# Patient Record
Sex: Female | Born: 1945
Health system: Southern US, Community
[De-identification: ages and names within clinical notes are randomized; demographics above are authoritative.]

## PROBLEM LIST (undated history)

## (undated) DIAGNOSIS — I10 Essential (primary) hypertension: Secondary | ICD-10-CM

## (undated) HISTORY — DX: Essential (primary) hypertension: I10

## (undated) HISTORY — PX: MASTECTOMY: SHX3

---

## 2015-04-06 ENCOUNTER — Other Ambulatory Visit: Payer: Self-pay | Admitting: Vascular Surgery

## 2015-04-06 DIAGNOSIS — R590 Localized enlarged lymph nodes: Secondary | ICD-10-CM

## 2015-04-14 ENCOUNTER — Ambulatory Visit
Admission: RE | Admit: 2015-04-14 | Discharge: 2015-04-14 | Disposition: A | Discharge status: Home / Self Care | Payer: Medicare HMO | Source: Ambulatory Visit | Attending: Vascular Surgery | Admitting: Vascular Surgery

## 2015-04-14 DIAGNOSIS — R590 Localized enlarged lymph nodes: Secondary | ICD-10-CM

## 2015-04-14 MED ORDER — GADOBENATE DIMEGLUMINE 529 MG/ML IV SOLN
19.0000 mL | Freq: Once | INTRAVENOUS | Status: AC | PRN
  Administered 2015-04-14: 19 mL via INTRAVENOUS

## 2015-04-18 ENCOUNTER — Other Ambulatory Visit: Payer: Self-pay

## 2015-09-14 DIAGNOSIS — Z23 Encounter for immunization: Secondary | ICD-10-CM | POA: Diagnosis not present

## 2015-09-19 DIAGNOSIS — C50911 Malignant neoplasm of unspecified site of right female breast: Secondary | ICD-10-CM | POA: Diagnosis not present

## 2015-09-19 DIAGNOSIS — C50919 Malignant neoplasm of unspecified site of unspecified female breast: Secondary | ICD-10-CM | POA: Diagnosis not present

## 2015-09-19 DIAGNOSIS — R590 Localized enlarged lymph nodes: Secondary | ICD-10-CM | POA: Diagnosis not present

## 2015-09-21 DIAGNOSIS — C773 Secondary and unspecified malignant neoplasm of axilla and upper limb lymph nodes: Secondary | ICD-10-CM | POA: Diagnosis not present

## 2015-09-21 DIAGNOSIS — C50911 Malignant neoplasm of unspecified site of right female breast: Secondary | ICD-10-CM | POA: Diagnosis not present

## 2015-09-26 DIAGNOSIS — Z6831 Body mass index (BMI) 31.0-31.9, adult: Secondary | ICD-10-CM | POA: Diagnosis not present

## 2015-09-26 DIAGNOSIS — C773 Secondary and unspecified malignant neoplasm of axilla and upper limb lymph nodes: Secondary | ICD-10-CM | POA: Diagnosis not present

## 2015-09-26 DIAGNOSIS — R590 Localized enlarged lymph nodes: Secondary | ICD-10-CM | POA: Diagnosis not present

## 2015-09-26 DIAGNOSIS — E669 Obesity, unspecified: Secondary | ICD-10-CM | POA: Diagnosis not present

## 2015-10-03 DIAGNOSIS — Z01818 Encounter for other preprocedural examination: Secondary | ICD-10-CM | POA: Diagnosis not present

## 2015-10-05 DIAGNOSIS — E7800 Pure hypercholesterolemia, unspecified: Secondary | ICD-10-CM | POA: Diagnosis not present

## 2015-10-05 DIAGNOSIS — C773 Secondary and unspecified malignant neoplasm of axilla and upper limb lymph nodes: Secondary | ICD-10-CM | POA: Diagnosis not present

## 2015-10-05 DIAGNOSIS — Z7982 Long term (current) use of aspirin: Secondary | ICD-10-CM | POA: Diagnosis not present

## 2015-10-05 DIAGNOSIS — C50911 Malignant neoplasm of unspecified site of right female breast: Secondary | ICD-10-CM | POA: Diagnosis not present

## 2015-10-05 DIAGNOSIS — C801 Malignant (primary) neoplasm, unspecified: Secondary | ICD-10-CM | POA: Diagnosis not present

## 2015-10-05 DIAGNOSIS — E119 Type 2 diabetes mellitus without complications: Secondary | ICD-10-CM | POA: Diagnosis not present

## 2015-10-05 DIAGNOSIS — Z79899 Other long term (current) drug therapy: Secondary | ICD-10-CM | POA: Diagnosis not present

## 2015-10-05 DIAGNOSIS — I10 Essential (primary) hypertension: Secondary | ICD-10-CM | POA: Diagnosis not present

## 2015-10-05 DIAGNOSIS — E669 Obesity, unspecified: Secondary | ICD-10-CM | POA: Diagnosis not present

## 2015-10-05 DIAGNOSIS — Z6831 Body mass index (BMI) 31.0-31.9, adult: Secondary | ICD-10-CM | POA: Diagnosis not present

## 2015-11-02 DIAGNOSIS — C50911 Malignant neoplasm of unspecified site of right female breast: Secondary | ICD-10-CM | POA: Diagnosis not present

## 2015-11-04 DIAGNOSIS — C50911 Malignant neoplasm of unspecified site of right female breast: Secondary | ICD-10-CM | POA: Diagnosis not present

## 2015-11-04 DIAGNOSIS — Z510 Encounter for antineoplastic radiation therapy: Secondary | ICD-10-CM | POA: Diagnosis not present

## 2015-11-09 DIAGNOSIS — Z510 Encounter for antineoplastic radiation therapy: Secondary | ICD-10-CM | POA: Diagnosis not present

## 2015-11-09 DIAGNOSIS — S52572A Other intraarticular fracture of lower end of left radius, initial encounter for closed fracture: Secondary | ICD-10-CM | POA: Diagnosis not present

## 2015-11-09 DIAGNOSIS — Z853 Personal history of malignant neoplasm of breast: Secondary | ICD-10-CM | POA: Diagnosis not present

## 2015-11-09 DIAGNOSIS — W109XXA Fall (on) (from) unspecified stairs and steps, initial encounter: Secondary | ICD-10-CM | POA: Diagnosis not present

## 2015-11-09 DIAGNOSIS — S59202A Unspecified physeal fracture of lower end of radius, left arm, initial encounter for closed fracture: Secondary | ICD-10-CM | POA: Diagnosis not present

## 2015-11-09 DIAGNOSIS — S0003XA Contusion of scalp, initial encounter: Secondary | ICD-10-CM | POA: Diagnosis not present

## 2015-11-09 DIAGNOSIS — R51 Headache: Secondary | ICD-10-CM | POA: Diagnosis not present

## 2015-11-09 DIAGNOSIS — C50911 Malignant neoplasm of unspecified site of right female breast: Secondary | ICD-10-CM | POA: Diagnosis not present

## 2015-11-09 DIAGNOSIS — S52502A Unspecified fracture of the lower end of left radius, initial encounter for closed fracture: Secondary | ICD-10-CM | POA: Diagnosis not present

## 2015-11-09 DIAGNOSIS — S0083XA Contusion of other part of head, initial encounter: Secondary | ICD-10-CM | POA: Diagnosis not present

## 2015-11-10 DIAGNOSIS — C50911 Malignant neoplasm of unspecified site of right female breast: Secondary | ICD-10-CM | POA: Diagnosis not present

## 2015-11-14 DIAGNOSIS — S52502A Unspecified fracture of the lower end of left radius, initial encounter for closed fracture: Secondary | ICD-10-CM | POA: Diagnosis not present

## 2015-11-16 DIAGNOSIS — Z510 Encounter for antineoplastic radiation therapy: Secondary | ICD-10-CM | POA: Diagnosis not present

## 2015-11-16 DIAGNOSIS — C50911 Malignant neoplasm of unspecified site of right female breast: Secondary | ICD-10-CM | POA: Diagnosis not present

## 2015-11-17 DIAGNOSIS — C50911 Malignant neoplasm of unspecified site of right female breast: Secondary | ICD-10-CM | POA: Diagnosis not present

## 2015-11-17 DIAGNOSIS — Z510 Encounter for antineoplastic radiation therapy: Secondary | ICD-10-CM | POA: Diagnosis not present

## 2015-11-18 DIAGNOSIS — Z510 Encounter for antineoplastic radiation therapy: Secondary | ICD-10-CM | POA: Diagnosis not present

## 2015-11-18 DIAGNOSIS — C50911 Malignant neoplasm of unspecified site of right female breast: Secondary | ICD-10-CM | POA: Diagnosis not present

## 2015-11-22 DIAGNOSIS — Z510 Encounter for antineoplastic radiation therapy: Secondary | ICD-10-CM | POA: Diagnosis not present

## 2015-11-22 DIAGNOSIS — C50911 Malignant neoplasm of unspecified site of right female breast: Secondary | ICD-10-CM | POA: Diagnosis not present

## 2015-11-23 DIAGNOSIS — Z510 Encounter for antineoplastic radiation therapy: Secondary | ICD-10-CM | POA: Diagnosis not present

## 2015-11-23 DIAGNOSIS — C50911 Malignant neoplasm of unspecified site of right female breast: Secondary | ICD-10-CM | POA: Diagnosis not present

## 2015-11-24 DIAGNOSIS — C50911 Malignant neoplasm of unspecified site of right female breast: Secondary | ICD-10-CM | POA: Diagnosis not present

## 2015-11-24 DIAGNOSIS — Z510 Encounter for antineoplastic radiation therapy: Secondary | ICD-10-CM | POA: Diagnosis not present

## 2015-11-25 DIAGNOSIS — C50911 Malignant neoplasm of unspecified site of right female breast: Secondary | ICD-10-CM | POA: Diagnosis not present

## 2015-11-25 DIAGNOSIS — Z510 Encounter for antineoplastic radiation therapy: Secondary | ICD-10-CM | POA: Diagnosis not present

## 2015-11-29 DIAGNOSIS — C50911 Malignant neoplasm of unspecified site of right female breast: Secondary | ICD-10-CM | POA: Diagnosis not present

## 2015-11-29 DIAGNOSIS — Z510 Encounter for antineoplastic radiation therapy: Secondary | ICD-10-CM | POA: Diagnosis not present

## 2015-11-30 DIAGNOSIS — C50911 Malignant neoplasm of unspecified site of right female breast: Secondary | ICD-10-CM | POA: Diagnosis not present

## 2015-11-30 DIAGNOSIS — Z510 Encounter for antineoplastic radiation therapy: Secondary | ICD-10-CM | POA: Diagnosis not present

## 2015-12-01 DIAGNOSIS — C50911 Malignant neoplasm of unspecified site of right female breast: Secondary | ICD-10-CM | POA: Diagnosis not present

## 2015-12-01 DIAGNOSIS — Z510 Encounter for antineoplastic radiation therapy: Secondary | ICD-10-CM | POA: Diagnosis not present

## 2015-12-02 DIAGNOSIS — Z510 Encounter for antineoplastic radiation therapy: Secondary | ICD-10-CM | POA: Diagnosis not present

## 2015-12-02 DIAGNOSIS — C50911 Malignant neoplasm of unspecified site of right female breast: Secondary | ICD-10-CM | POA: Diagnosis not present

## 2015-12-05 DIAGNOSIS — Z510 Encounter for antineoplastic radiation therapy: Secondary | ICD-10-CM | POA: Diagnosis not present

## 2015-12-05 DIAGNOSIS — C50911 Malignant neoplasm of unspecified site of right female breast: Secondary | ICD-10-CM | POA: Diagnosis not present

## 2015-12-06 DIAGNOSIS — C50911 Malignant neoplasm of unspecified site of right female breast: Secondary | ICD-10-CM | POA: Diagnosis not present

## 2015-12-06 DIAGNOSIS — Z510 Encounter for antineoplastic radiation therapy: Secondary | ICD-10-CM | POA: Diagnosis not present

## 2015-12-07 DIAGNOSIS — C50819 Malignant neoplasm of overlapping sites of unspecified female breast: Secondary | ICD-10-CM | POA: Diagnosis not present

## 2015-12-07 DIAGNOSIS — Z79899 Other long term (current) drug therapy: Secondary | ICD-10-CM | POA: Diagnosis not present

## 2015-12-07 DIAGNOSIS — C50911 Malignant neoplasm of unspecified site of right female breast: Secondary | ICD-10-CM | POA: Diagnosis not present

## 2015-12-07 DIAGNOSIS — E7800 Pure hypercholesterolemia, unspecified: Secondary | ICD-10-CM | POA: Diagnosis not present

## 2015-12-07 DIAGNOSIS — Z510 Encounter for antineoplastic radiation therapy: Secondary | ICD-10-CM | POA: Diagnosis not present

## 2015-12-07 DIAGNOSIS — S52532D Colles' fracture of left radius, subsequent encounter for closed fracture with routine healing: Secondary | ICD-10-CM | POA: Diagnosis not present

## 2015-12-07 DIAGNOSIS — I10 Essential (primary) hypertension: Secondary | ICD-10-CM | POA: Diagnosis not present

## 2015-12-07 DIAGNOSIS — E119 Type 2 diabetes mellitus without complications: Secondary | ICD-10-CM | POA: Diagnosis not present

## 2015-12-08 DIAGNOSIS — C50911 Malignant neoplasm of unspecified site of right female breast: Secondary | ICD-10-CM | POA: Diagnosis not present

## 2015-12-08 DIAGNOSIS — E119 Type 2 diabetes mellitus without complications: Secondary | ICD-10-CM | POA: Diagnosis not present

## 2015-12-08 DIAGNOSIS — Z510 Encounter for antineoplastic radiation therapy: Secondary | ICD-10-CM | POA: Diagnosis not present

## 2015-12-09 DIAGNOSIS — C50911 Malignant neoplasm of unspecified site of right female breast: Secondary | ICD-10-CM | POA: Diagnosis not present

## 2015-12-09 DIAGNOSIS — Z510 Encounter for antineoplastic radiation therapy: Secondary | ICD-10-CM | POA: Diagnosis not present

## 2015-12-10 DIAGNOSIS — H524 Presbyopia: Secondary | ICD-10-CM | POA: Diagnosis not present

## 2015-12-10 DIAGNOSIS — E119 Type 2 diabetes mellitus without complications: Secondary | ICD-10-CM | POA: Diagnosis not present

## 2015-12-10 DIAGNOSIS — H5213 Myopia, bilateral: Secondary | ICD-10-CM | POA: Diagnosis not present

## 2015-12-12 DIAGNOSIS — C50911 Malignant neoplasm of unspecified site of right female breast: Secondary | ICD-10-CM | POA: Diagnosis not present

## 2015-12-12 DIAGNOSIS — Z510 Encounter for antineoplastic radiation therapy: Secondary | ICD-10-CM | POA: Diagnosis not present

## 2015-12-13 DIAGNOSIS — Z510 Encounter for antineoplastic radiation therapy: Secondary | ICD-10-CM | POA: Diagnosis not present

## 2015-12-13 DIAGNOSIS — C50911 Malignant neoplasm of unspecified site of right female breast: Secondary | ICD-10-CM | POA: Diagnosis not present

## 2015-12-14 DIAGNOSIS — Z510 Encounter for antineoplastic radiation therapy: Secondary | ICD-10-CM | POA: Diagnosis not present

## 2015-12-14 DIAGNOSIS — C50911 Malignant neoplasm of unspecified site of right female breast: Secondary | ICD-10-CM | POA: Diagnosis not present

## 2015-12-15 DIAGNOSIS — Z510 Encounter for antineoplastic radiation therapy: Secondary | ICD-10-CM | POA: Diagnosis not present

## 2015-12-15 DIAGNOSIS — C50911 Malignant neoplasm of unspecified site of right female breast: Secondary | ICD-10-CM | POA: Diagnosis not present

## 2015-12-16 DIAGNOSIS — Z510 Encounter for antineoplastic radiation therapy: Secondary | ICD-10-CM | POA: Diagnosis not present

## 2015-12-16 DIAGNOSIS — C50911 Malignant neoplasm of unspecified site of right female breast: Secondary | ICD-10-CM | POA: Diagnosis not present

## 2015-12-19 DIAGNOSIS — S52532D Colles' fracture of left radius, subsequent encounter for closed fracture with routine healing: Secondary | ICD-10-CM | POA: Diagnosis not present

## 2015-12-19 DIAGNOSIS — Z510 Encounter for antineoplastic radiation therapy: Secondary | ICD-10-CM | POA: Diagnosis not present

## 2015-12-19 DIAGNOSIS — C50911 Malignant neoplasm of unspecified site of right female breast: Secondary | ICD-10-CM | POA: Diagnosis not present

## 2015-12-20 DIAGNOSIS — C50911 Malignant neoplasm of unspecified site of right female breast: Secondary | ICD-10-CM | POA: Diagnosis not present

## 2015-12-20 DIAGNOSIS — Z510 Encounter for antineoplastic radiation therapy: Secondary | ICD-10-CM | POA: Diagnosis not present

## 2015-12-21 DIAGNOSIS — Z510 Encounter for antineoplastic radiation therapy: Secondary | ICD-10-CM | POA: Diagnosis not present

## 2015-12-21 DIAGNOSIS — C50911 Malignant neoplasm of unspecified site of right female breast: Secondary | ICD-10-CM | POA: Diagnosis not present

## 2015-12-22 DIAGNOSIS — Z510 Encounter for antineoplastic radiation therapy: Secondary | ICD-10-CM | POA: Diagnosis not present

## 2015-12-22 DIAGNOSIS — C50911 Malignant neoplasm of unspecified site of right female breast: Secondary | ICD-10-CM | POA: Diagnosis not present

## 2015-12-23 DIAGNOSIS — Z510 Encounter for antineoplastic radiation therapy: Secondary | ICD-10-CM | POA: Diagnosis not present

## 2015-12-23 DIAGNOSIS — C50911 Malignant neoplasm of unspecified site of right female breast: Secondary | ICD-10-CM | POA: Diagnosis not present

## 2015-12-26 DIAGNOSIS — Z510 Encounter for antineoplastic radiation therapy: Secondary | ICD-10-CM | POA: Diagnosis not present

## 2015-12-26 DIAGNOSIS — C50911 Malignant neoplasm of unspecified site of right female breast: Secondary | ICD-10-CM | POA: Diagnosis not present

## 2015-12-27 DIAGNOSIS — Z510 Encounter for antineoplastic radiation therapy: Secondary | ICD-10-CM | POA: Diagnosis not present

## 2015-12-27 DIAGNOSIS — C50911 Malignant neoplasm of unspecified site of right female breast: Secondary | ICD-10-CM | POA: Diagnosis not present

## 2015-12-28 DIAGNOSIS — Z510 Encounter for antineoplastic radiation therapy: Secondary | ICD-10-CM | POA: Diagnosis not present

## 2015-12-28 DIAGNOSIS — C50911 Malignant neoplasm of unspecified site of right female breast: Secondary | ICD-10-CM | POA: Diagnosis not present

## 2015-12-29 DIAGNOSIS — Z510 Encounter for antineoplastic radiation therapy: Secondary | ICD-10-CM | POA: Diagnosis not present

## 2015-12-29 DIAGNOSIS — C50911 Malignant neoplasm of unspecified site of right female breast: Secondary | ICD-10-CM | POA: Diagnosis not present

## 2015-12-30 DIAGNOSIS — Z510 Encounter for antineoplastic radiation therapy: Secondary | ICD-10-CM | POA: Diagnosis not present

## 2015-12-30 DIAGNOSIS — C50911 Malignant neoplasm of unspecified site of right female breast: Secondary | ICD-10-CM | POA: Diagnosis not present

## 2016-01-02 DIAGNOSIS — C50911 Malignant neoplasm of unspecified site of right female breast: Secondary | ICD-10-CM | POA: Diagnosis not present

## 2016-01-03 DIAGNOSIS — Z510 Encounter for antineoplastic radiation therapy: Secondary | ICD-10-CM | POA: Diagnosis not present

## 2016-01-03 DIAGNOSIS — C50911 Malignant neoplasm of unspecified site of right female breast: Secondary | ICD-10-CM | POA: Diagnosis not present

## 2016-01-04 DIAGNOSIS — C50911 Malignant neoplasm of unspecified site of right female breast: Secondary | ICD-10-CM | POA: Diagnosis not present

## 2016-01-04 DIAGNOSIS — Z510 Encounter for antineoplastic radiation therapy: Secondary | ICD-10-CM | POA: Diagnosis not present

## 2016-01-09 DIAGNOSIS — S52532D Colles' fracture of left radius, subsequent encounter for closed fracture with routine healing: Secondary | ICD-10-CM | POA: Diagnosis not present

## 2016-01-19 DIAGNOSIS — H18411 Arcus senilis, right eye: Secondary | ICD-10-CM | POA: Diagnosis not present

## 2016-01-19 DIAGNOSIS — H18412 Arcus senilis, left eye: Secondary | ICD-10-CM | POA: Diagnosis not present

## 2016-01-19 DIAGNOSIS — H2512 Age-related nuclear cataract, left eye: Secondary | ICD-10-CM | POA: Diagnosis not present

## 2016-01-19 DIAGNOSIS — H2511 Age-related nuclear cataract, right eye: Secondary | ICD-10-CM | POA: Diagnosis not present

## 2016-02-01 DIAGNOSIS — C50911 Malignant neoplasm of unspecified site of right female breast: Secondary | ICD-10-CM | POA: Diagnosis not present

## 2016-02-01 DIAGNOSIS — C779 Secondary and unspecified malignant neoplasm of lymph node, unspecified: Secondary | ICD-10-CM | POA: Diagnosis not present

## 2016-02-01 DIAGNOSIS — C773 Secondary and unspecified malignant neoplasm of axilla and upper limb lymph nodes: Secondary | ICD-10-CM | POA: Diagnosis not present

## 2016-02-01 DIAGNOSIS — C50919 Malignant neoplasm of unspecified site of unspecified female breast: Secondary | ICD-10-CM | POA: Diagnosis not present

## 2016-02-09 DIAGNOSIS — E785 Hyperlipidemia, unspecified: Secondary | ICD-10-CM

## 2016-02-09 DIAGNOSIS — E088 Diabetes mellitus due to underlying condition with unspecified complications: Secondary | ICD-10-CM

## 2016-02-09 DIAGNOSIS — I10 Essential (primary) hypertension: Secondary | ICD-10-CM

## 2016-02-09 HISTORY — DX: Essential (primary) hypertension: I10

## 2016-02-09 HISTORY — DX: Diabetes mellitus due to underlying condition with unspecified complications: E08.8

## 2016-02-09 HISTORY — DX: Hyperlipidemia, unspecified: E78.5

## 2016-02-24 DIAGNOSIS — H2512 Age-related nuclear cataract, left eye: Secondary | ICD-10-CM | POA: Diagnosis not present

## 2016-02-24 DIAGNOSIS — H25812 Combined forms of age-related cataract, left eye: Secondary | ICD-10-CM | POA: Diagnosis not present

## 2016-02-24 DIAGNOSIS — H2511 Age-related nuclear cataract, right eye: Secondary | ICD-10-CM | POA: Diagnosis not present

## 2016-03-02 DIAGNOSIS — H2513 Age-related nuclear cataract, bilateral: Secondary | ICD-10-CM | POA: Diagnosis not present

## 2016-03-05 DIAGNOSIS — H25811 Combined forms of age-related cataract, right eye: Secondary | ICD-10-CM | POA: Diagnosis not present

## 2016-03-05 DIAGNOSIS — H2511 Age-related nuclear cataract, right eye: Secondary | ICD-10-CM | POA: Diagnosis not present

## 2016-03-12 DIAGNOSIS — H2513 Age-related nuclear cataract, bilateral: Secondary | ICD-10-CM | POA: Diagnosis not present

## 2016-03-18 IMAGING — MR MR BREAST BILAT WO/W CM
6 of 13 series · 23 of 48 positions shown · IV contrast (19cc multihance)
Comparison: Previous exam(s).

CLINICAL DATA: History of biopsy-proven metastatic right axillary
lymph node 03/14/2015 suggesting ductal origin. Patient's mammogram
03/01/2015 is negative, other than the abnormal right axillary
adenopathy. MRI performed to evaluate for occult breast primary
malignancy.

LABS:  Labs at [HOSPITAL], BUN/creatinine =20/1.0 with
GFR 59.
EXAM:
BILATERAL BREAST MRI WITH AND WITHOUT CONTRAST
TECHNIQUE: Multiplanar, multisequence MR images of both breasts were obtained
prior to and following the intravenous administration of 19 ml of
MultiHance.

[Series 2: T2 · axial · 3.0mm · 0.48mm/px · z∈[-105,+63]mm · 3 of 57 slices shown]
[im 1/57]
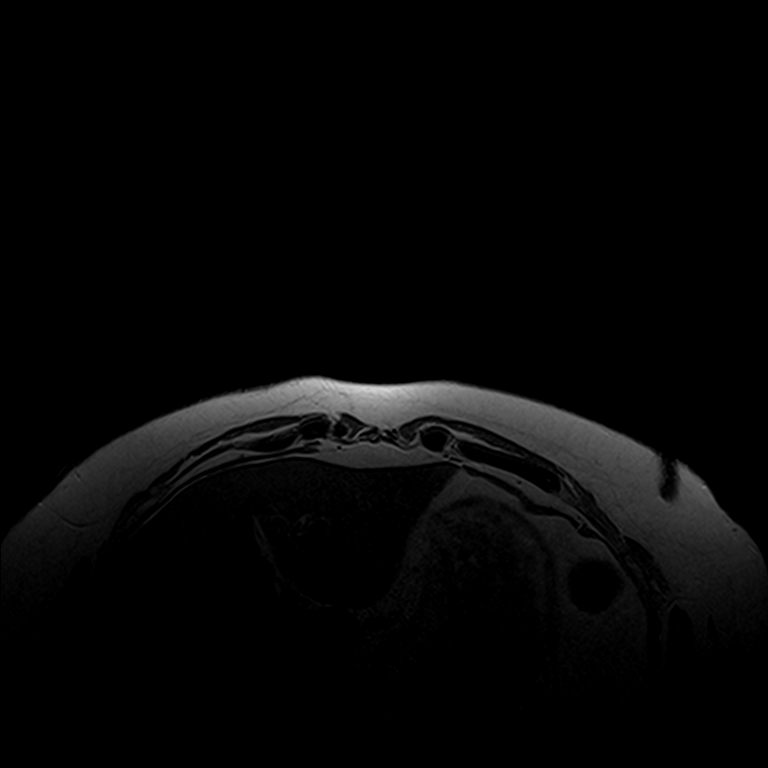
[im 29/57]
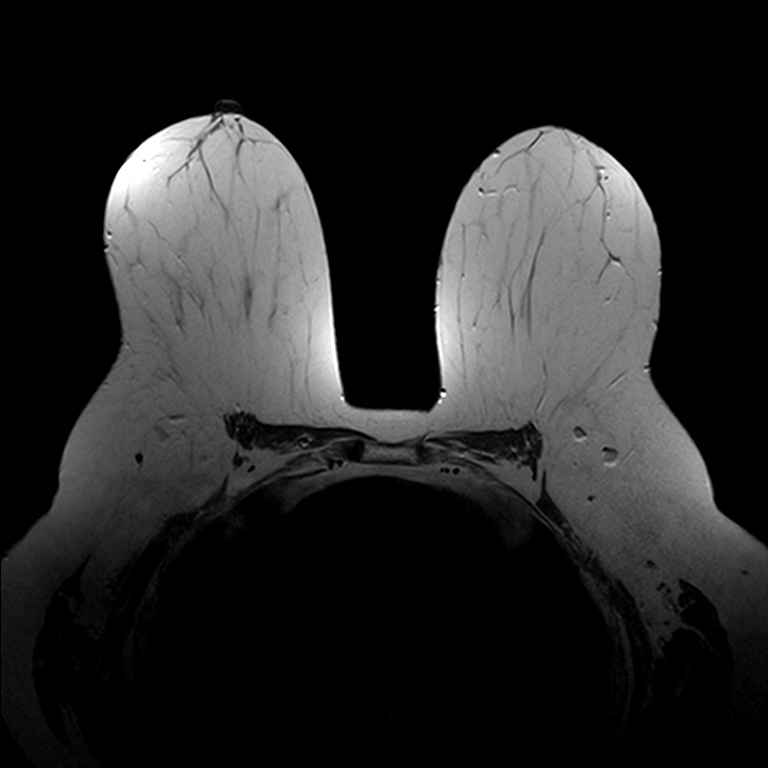
[im 57/57]
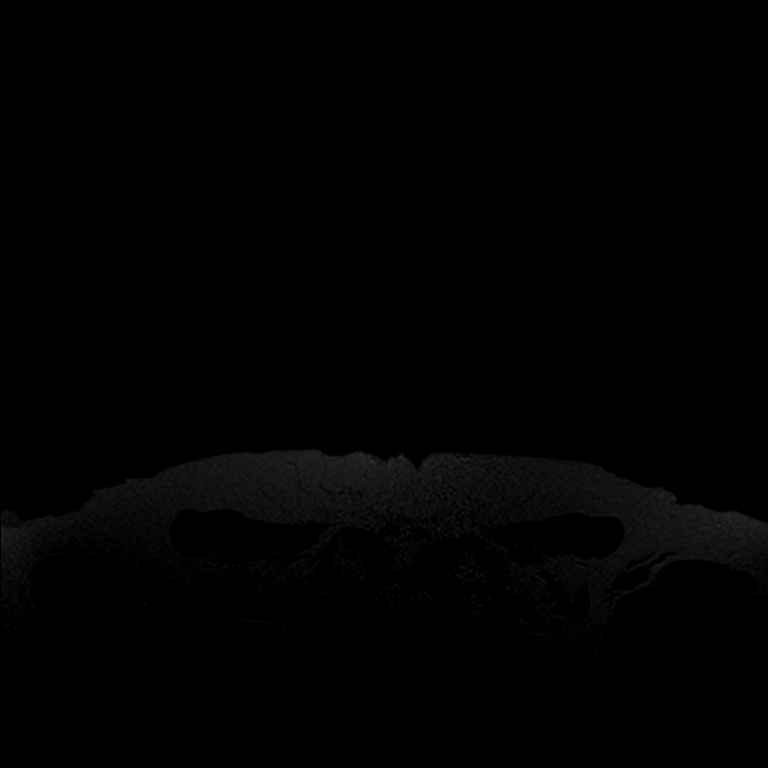

[Series 3: t2_tirm_tra ipat (a-p) · axial · 3.0mm · 0.72mm/px · z∈[-105,+63]mm · 2 of 57 slices shown]
[im 1/57]
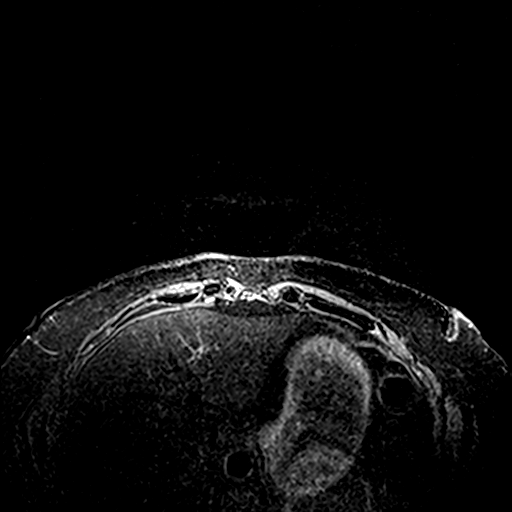
[im 57/57]
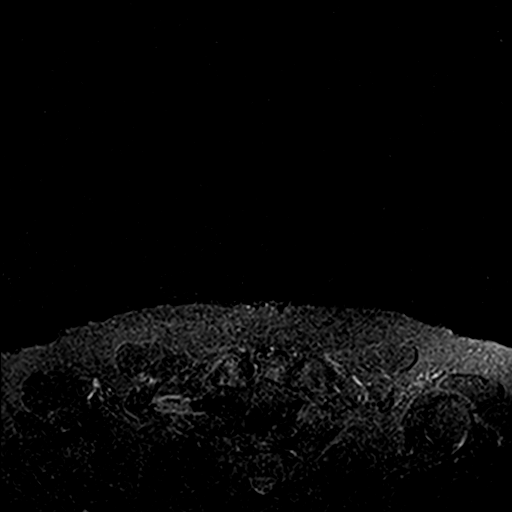

[Series 4: fl3d pre-cm no · axial · non-contrast · 1.2mm · 0.96mm/px · z∈[-116,+74]mm · 5 of 160 slices shown]
[im 1/160]
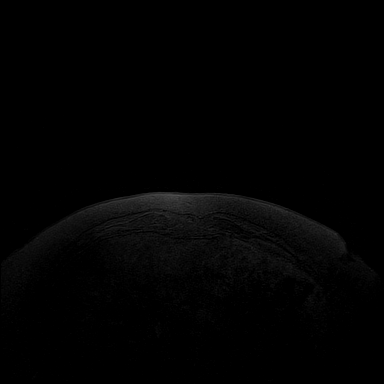
[im 40/160]
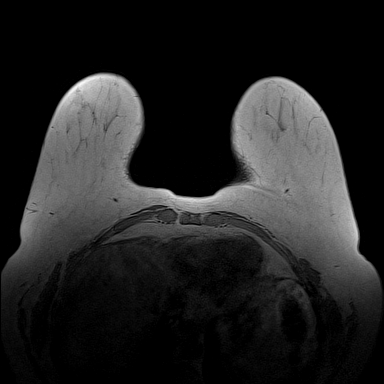
[im 80/160]
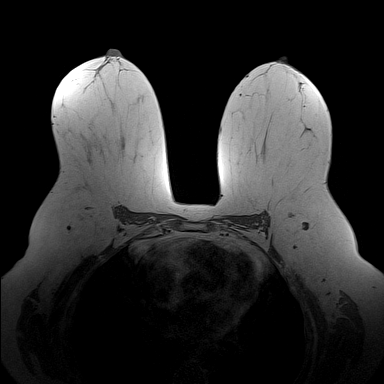
[im 120/160]
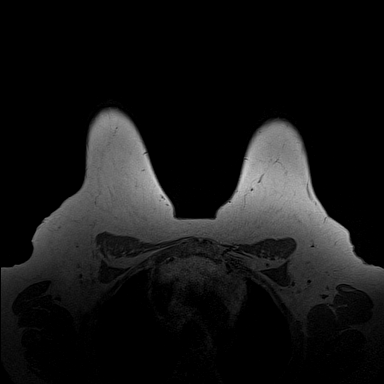
[im 160/160]
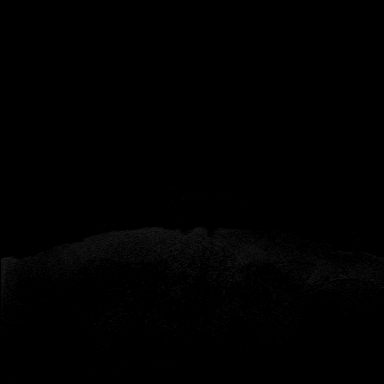

[Series 5: fl3d pre-cm · axial · non-contrast · 1.2mm · 0.96mm/px · z∈[-116,+74]mm · 5 of 160 slices shown]
[im 1/160]
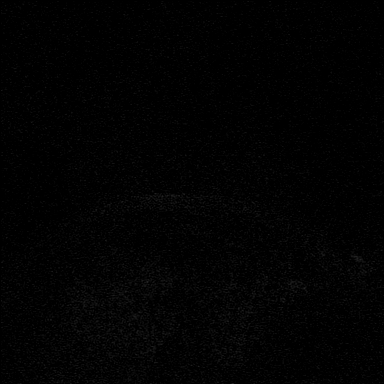
[im 40/160]
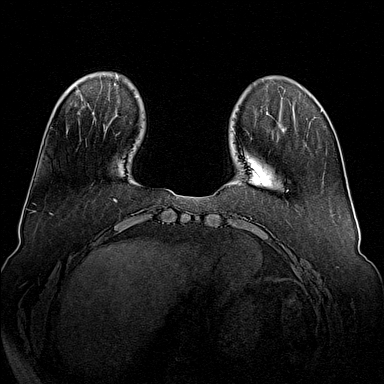
[im 80/160]
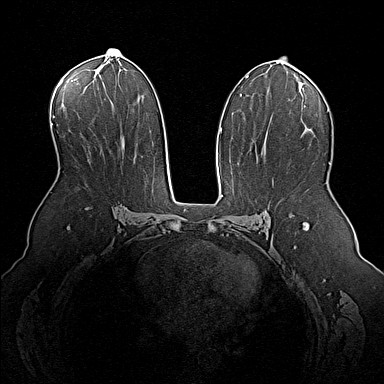
[im 120/160]
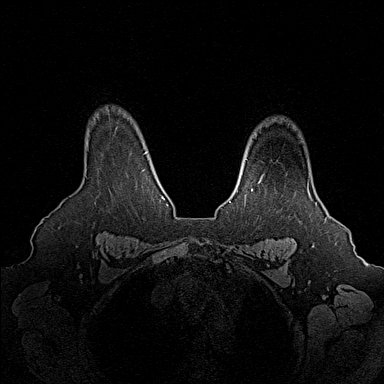
[im 160/160]
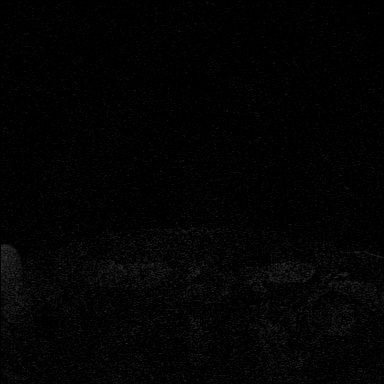

[Series 6: fl3d post immediate · axial · 1.2mm · 0.96mm/px · z∈[-116,+74]mm · 5 of 160 slices shown (1 of 2)]
[im 1/160]
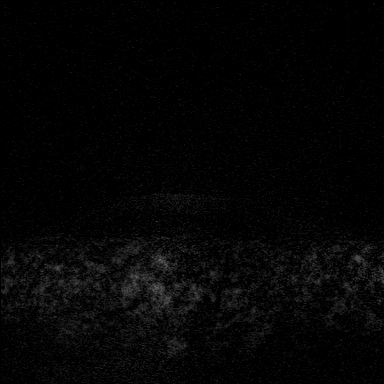
[im 40/160]
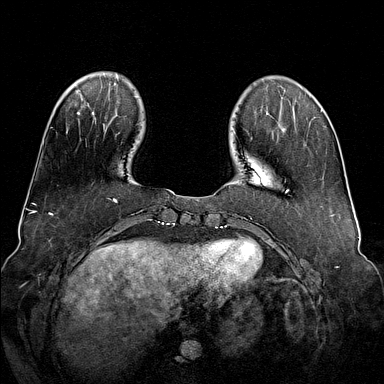
[im 80/160]
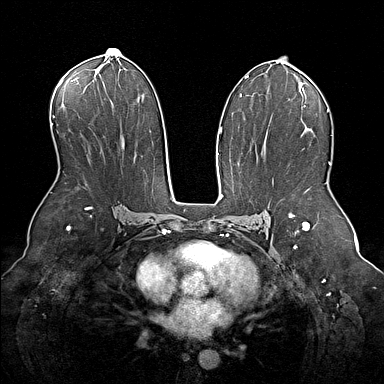
[im 120/160]
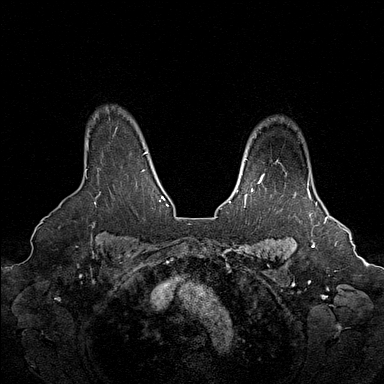
[im 160/160]
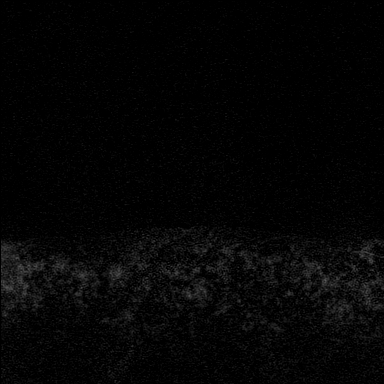

[Series 7: fl3d post immediate · axial · 1.2mm · 0.96mm/px · z∈[-116,-22]mm · 3 of 160 slices shown (2 of 2)]
[im 1/160]
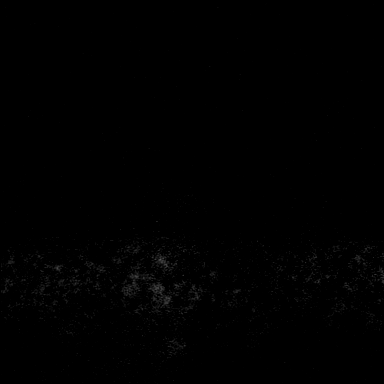
[im 40/160]
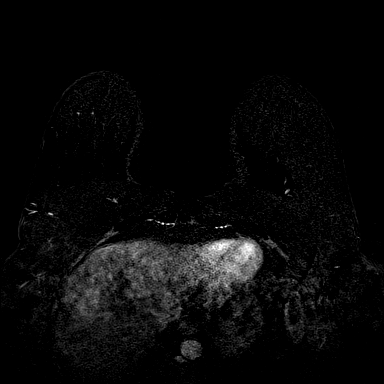
[im 80/160]
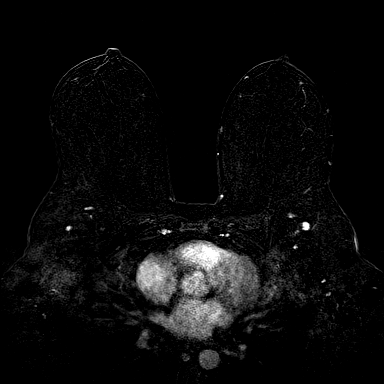

[23 of 48 positions shown; findings below may reference images not displayed]

THREE-DIMENSIONAL MR IMAGE RENDERING ON INDEPENDENT WORKSTATION:

Three-dimensional MR images were rendered by post-processing of the
original MR data on an independent workstation. The
three-dimensional MR images were interpreted, and findings are
reported in the following complete MRI report for this study. Three
dimensional images were evaluated at the independent [employer]
workstation
FINDINGS: Breast composition:  Almost entirely fat.

Background parenchymal enhancement: Minimal.

Right breast: No suspicious mass or abnormal enhancement. 6 mm oval
circumscribed enhancing mass with central fatty hilum over the inner
upper quadrant compatible with an intramammary lymph node
correlating with patient's mammogram.

Left breast: No mass or abnormal enhancement.

Lymph nodes: Biopsy-proven metastatic right axillary lymph node
measuring 0.9 x 1.9 x 0.8 cm. A few other smaller adjacent
suspicious right axillary lymph nodes are present. No significant
left axillary adenopathy no internal mammary adenopathy.

Ancillary findings: 2 small T1/T2 low signal foci with enhancement
over the lower body of the sternum as cannot exclude metastatic
disease.
IMPRESSION: Evidence of patient's biopsy-proven metastatic right axillary
adenopathy. No evidence of suspicious mass or enhancement within the
right breast.

Two small enhancing foci over the inferior body of the sternum as
cannot exclude metastatic disease.

RECOMMENDATION:
Recommend followup as per clinical treatment plan. Consider PET-CT
to search for primary malignancy. Also consider bone scan to exclude
osseous metastatic disease.

BI-RADS CATEGORY  6: Known biopsy-proven malignancy.

## 2016-03-19 DIAGNOSIS — E119 Type 2 diabetes mellitus without complications: Secondary | ICD-10-CM | POA: Diagnosis not present

## 2016-03-19 DIAGNOSIS — I10 Essential (primary) hypertension: Secondary | ICD-10-CM | POA: Diagnosis not present

## 2016-03-19 DIAGNOSIS — E559 Vitamin D deficiency, unspecified: Secondary | ICD-10-CM | POA: Diagnosis not present

## 2016-03-19 DIAGNOSIS — E7800 Pure hypercholesterolemia, unspecified: Secondary | ICD-10-CM | POA: Diagnosis not present

## 2016-03-23 DIAGNOSIS — E559 Vitamin D deficiency, unspecified: Secondary | ICD-10-CM | POA: Diagnosis not present

## 2016-03-23 DIAGNOSIS — E119 Type 2 diabetes mellitus without complications: Secondary | ICD-10-CM | POA: Diagnosis not present

## 2016-03-23 DIAGNOSIS — E7800 Pure hypercholesterolemia, unspecified: Secondary | ICD-10-CM | POA: Diagnosis not present

## 2016-03-23 DIAGNOSIS — I10 Essential (primary) hypertension: Secondary | ICD-10-CM | POA: Diagnosis not present

## 2016-03-30 DIAGNOSIS — H2513 Age-related nuclear cataract, bilateral: Secondary | ICD-10-CM | POA: Diagnosis not present

## 2016-03-30 DIAGNOSIS — H5319 Other subjective visual disturbances: Secondary | ICD-10-CM | POA: Diagnosis not present

## 2016-04-16 DIAGNOSIS — Z6832 Body mass index (BMI) 32.0-32.9, adult: Secondary | ICD-10-CM

## 2016-04-16 DIAGNOSIS — C50911 Malignant neoplasm of unspecified site of right female breast: Secondary | ICD-10-CM | POA: Insufficient documentation

## 2016-04-16 DIAGNOSIS — C773 Secondary and unspecified malignant neoplasm of axilla and upper limb lymph nodes: Secondary | ICD-10-CM

## 2016-04-16 DIAGNOSIS — E669 Obesity, unspecified: Secondary | ICD-10-CM | POA: Diagnosis not present

## 2016-04-16 HISTORY — DX: Body mass index (BMI) 32.0-32.9, adult: Z68.32

## 2016-04-16 HISTORY — DX: Malignant neoplasm of unspecified site of right female breast: C50.911

## 2016-04-20 DIAGNOSIS — Z1231 Encounter for screening mammogram for malignant neoplasm of breast: Secondary | ICD-10-CM | POA: Diagnosis not present

## 2016-06-01 DIAGNOSIS — Z853 Personal history of malignant neoplasm of breast: Secondary | ICD-10-CM | POA: Diagnosis not present

## 2016-06-20 DIAGNOSIS — Z6834 Body mass index (BMI) 34.0-34.9, adult: Secondary | ICD-10-CM | POA: Diagnosis not present

## 2016-06-20 DIAGNOSIS — Z79899 Other long term (current) drug therapy: Secondary | ICD-10-CM | POA: Diagnosis not present

## 2016-06-20 DIAGNOSIS — Z0000 Encounter for general adult medical examination without abnormal findings: Secondary | ICD-10-CM | POA: Diagnosis not present

## 2016-06-20 DIAGNOSIS — E119 Type 2 diabetes mellitus without complications: Secondary | ICD-10-CM | POA: Diagnosis not present

## 2016-06-20 DIAGNOSIS — I10 Essential (primary) hypertension: Secondary | ICD-10-CM | POA: Diagnosis not present

## 2016-06-20 DIAGNOSIS — E669 Obesity, unspecified: Secondary | ICD-10-CM | POA: Diagnosis not present

## 2016-06-20 DIAGNOSIS — M859 Disorder of bone density and structure, unspecified: Secondary | ICD-10-CM | POA: Diagnosis not present

## 2016-06-20 DIAGNOSIS — K219 Gastro-esophageal reflux disease without esophagitis: Secondary | ICD-10-CM | POA: Diagnosis not present

## 2016-06-20 DIAGNOSIS — C50819 Malignant neoplasm of overlapping sites of unspecified female breast: Secondary | ICD-10-CM | POA: Diagnosis not present

## 2016-06-20 DIAGNOSIS — E7800 Pure hypercholesterolemia, unspecified: Secondary | ICD-10-CM | POA: Diagnosis not present

## 2016-06-21 DIAGNOSIS — E119 Type 2 diabetes mellitus without complications: Secondary | ICD-10-CM | POA: Diagnosis not present

## 2016-06-21 DIAGNOSIS — E7800 Pure hypercholesterolemia, unspecified: Secondary | ICD-10-CM | POA: Diagnosis not present

## 2016-08-13 DIAGNOSIS — E088 Diabetes mellitus due to underlying condition with unspecified complications: Secondary | ICD-10-CM | POA: Diagnosis not present

## 2016-08-13 DIAGNOSIS — E785 Hyperlipidemia, unspecified: Secondary | ICD-10-CM | POA: Diagnosis not present

## 2016-08-13 DIAGNOSIS — I10 Essential (primary) hypertension: Secondary | ICD-10-CM | POA: Diagnosis not present

## 2016-09-11 DIAGNOSIS — E785 Hyperlipidemia, unspecified: Secondary | ICD-10-CM | POA: Diagnosis not present

## 2016-09-11 DIAGNOSIS — R9439 Abnormal result of other cardiovascular function study: Secondary | ICD-10-CM | POA: Diagnosis not present

## 2016-09-11 DIAGNOSIS — I10 Essential (primary) hypertension: Secondary | ICD-10-CM | POA: Diagnosis not present

## 2016-09-11 DIAGNOSIS — E088 Diabetes mellitus due to underlying condition with unspecified complications: Secondary | ICD-10-CM | POA: Diagnosis not present

## 2016-09-11 DIAGNOSIS — R079 Chest pain, unspecified: Secondary | ICD-10-CM | POA: Diagnosis not present

## 2016-09-14 DIAGNOSIS — E785 Hyperlipidemia, unspecified: Secondary | ICD-10-CM | POA: Diagnosis not present

## 2016-09-14 DIAGNOSIS — E119 Type 2 diabetes mellitus without complications: Secondary | ICD-10-CM | POA: Diagnosis not present

## 2016-09-14 DIAGNOSIS — I2510 Atherosclerotic heart disease of native coronary artery without angina pectoris: Secondary | ICD-10-CM | POA: Diagnosis not present

## 2016-09-14 DIAGNOSIS — Z7984 Long term (current) use of oral hypoglycemic drugs: Secondary | ICD-10-CM | POA: Diagnosis not present

## 2016-09-14 DIAGNOSIS — Z7982 Long term (current) use of aspirin: Secondary | ICD-10-CM | POA: Diagnosis not present

## 2016-09-14 DIAGNOSIS — R0602 Shortness of breath: Secondary | ICD-10-CM | POA: Diagnosis not present

## 2016-09-14 DIAGNOSIS — Z79899 Other long term (current) drug therapy: Secondary | ICD-10-CM | POA: Diagnosis not present

## 2016-09-14 DIAGNOSIS — I10 Essential (primary) hypertension: Secondary | ICD-10-CM | POA: Diagnosis not present

## 2016-09-14 DIAGNOSIS — R931 Abnormal findings on diagnostic imaging of heart and coronary circulation: Secondary | ICD-10-CM | POA: Diagnosis not present

## 2016-09-14 DIAGNOSIS — Z8249 Family history of ischemic heart disease and other diseases of the circulatory system: Secondary | ICD-10-CM | POA: Diagnosis not present

## 2016-09-20 DIAGNOSIS — E119 Type 2 diabetes mellitus without complications: Secondary | ICD-10-CM | POA: Diagnosis not present

## 2016-09-20 DIAGNOSIS — Z23 Encounter for immunization: Secondary | ICD-10-CM | POA: Diagnosis not present

## 2016-09-20 DIAGNOSIS — E7800 Pure hypercholesterolemia, unspecified: Secondary | ICD-10-CM | POA: Diagnosis not present

## 2016-09-20 DIAGNOSIS — I10 Essential (primary) hypertension: Secondary | ICD-10-CM | POA: Diagnosis not present

## 2016-10-02 DIAGNOSIS — Z853 Personal history of malignant neoplasm of breast: Secondary | ICD-10-CM | POA: Diagnosis not present

## 2016-10-04 DIAGNOSIS — E785 Hyperlipidemia, unspecified: Secondary | ICD-10-CM | POA: Diagnosis not present

## 2016-10-04 DIAGNOSIS — I2510 Atherosclerotic heart disease of native coronary artery without angina pectoris: Secondary | ICD-10-CM | POA: Insufficient documentation

## 2016-10-04 DIAGNOSIS — I10 Essential (primary) hypertension: Secondary | ICD-10-CM | POA: Diagnosis not present

## 2016-10-04 DIAGNOSIS — E088 Diabetes mellitus due to underlying condition with unspecified complications: Secondary | ICD-10-CM | POA: Diagnosis not present

## 2016-10-04 HISTORY — DX: Atherosclerotic heart disease of native coronary artery without angina pectoris: I25.10

## 2016-10-23 DIAGNOSIS — Z452 Encounter for adjustment and management of vascular access device: Secondary | ICD-10-CM | POA: Insufficient documentation

## 2016-10-23 DIAGNOSIS — Z6833 Body mass index (BMI) 33.0-33.9, adult: Secondary | ICD-10-CM | POA: Diagnosis not present

## 2016-10-23 DIAGNOSIS — E669 Obesity, unspecified: Secondary | ICD-10-CM | POA: Diagnosis not present

## 2016-10-26 DIAGNOSIS — Z7984 Long term (current) use of oral hypoglycemic drugs: Secondary | ICD-10-CM | POA: Diagnosis not present

## 2016-10-26 DIAGNOSIS — Z853 Personal history of malignant neoplasm of breast: Secondary | ICD-10-CM | POA: Diagnosis not present

## 2016-10-26 DIAGNOSIS — I10 Essential (primary) hypertension: Secondary | ICD-10-CM | POA: Diagnosis not present

## 2016-10-26 DIAGNOSIS — E785 Hyperlipidemia, unspecified: Secondary | ICD-10-CM | POA: Diagnosis not present

## 2016-10-26 DIAGNOSIS — Z79899 Other long term (current) drug therapy: Secondary | ICD-10-CM | POA: Diagnosis not present

## 2016-10-26 DIAGNOSIS — I2510 Atherosclerotic heart disease of native coronary artery without angina pectoris: Secondary | ICD-10-CM | POA: Diagnosis not present

## 2016-10-26 DIAGNOSIS — Z452 Encounter for adjustment and management of vascular access device: Secondary | ICD-10-CM | POA: Diagnosis not present

## 2016-10-26 DIAGNOSIS — K219 Gastro-esophageal reflux disease without esophagitis: Secondary | ICD-10-CM | POA: Diagnosis not present

## 2016-10-26 DIAGNOSIS — E119 Type 2 diabetes mellitus without complications: Secondary | ICD-10-CM | POA: Diagnosis not present

## 2016-10-26 DIAGNOSIS — G4733 Obstructive sleep apnea (adult) (pediatric): Secondary | ICD-10-CM | POA: Diagnosis not present

## 2016-11-05 DIAGNOSIS — Z09 Encounter for follow-up examination after completed treatment for conditions other than malignant neoplasm: Secondary | ICD-10-CM | POA: Insufficient documentation

## 2016-11-14 DIAGNOSIS — E119 Type 2 diabetes mellitus without complications: Secondary | ICD-10-CM | POA: Diagnosis not present

## 2016-11-14 DIAGNOSIS — Z853 Personal history of malignant neoplasm of breast: Secondary | ICD-10-CM | POA: Diagnosis not present

## 2016-11-14 DIAGNOSIS — Z9011 Acquired absence of right breast and nipple: Secondary | ICD-10-CM

## 2016-11-14 DIAGNOSIS — Z7984 Long term (current) use of oral hypoglycemic drugs: Secondary | ICD-10-CM | POA: Diagnosis not present

## 2016-11-14 DIAGNOSIS — I10 Essential (primary) hypertension: Secondary | ICD-10-CM | POA: Diagnosis not present

## 2016-11-14 HISTORY — DX: Acquired absence of right breast and nipple: Z90.11

## 2016-11-15 DIAGNOSIS — C50911 Malignant neoplasm of unspecified site of right female breast: Secondary | ICD-10-CM | POA: Diagnosis not present

## 2016-11-15 DIAGNOSIS — I2510 Atherosclerotic heart disease of native coronary artery without angina pectoris: Secondary | ICD-10-CM | POA: Diagnosis not present

## 2016-12-24 DIAGNOSIS — Z79899 Other long term (current) drug therapy: Secondary | ICD-10-CM | POA: Diagnosis not present

## 2016-12-24 DIAGNOSIS — E119 Type 2 diabetes mellitus without complications: Secondary | ICD-10-CM | POA: Diagnosis not present

## 2016-12-24 DIAGNOSIS — E7800 Pure hypercholesterolemia, unspecified: Secondary | ICD-10-CM | POA: Diagnosis not present

## 2016-12-24 DIAGNOSIS — I10 Essential (primary) hypertension: Secondary | ICD-10-CM | POA: Diagnosis not present

## 2017-01-14 DIAGNOSIS — I10 Essential (primary) hypertension: Secondary | ICD-10-CM | POA: Diagnosis not present

## 2017-01-14 DIAGNOSIS — I2510 Atherosclerotic heart disease of native coronary artery without angina pectoris: Secondary | ICD-10-CM | POA: Diagnosis not present

## 2017-01-14 DIAGNOSIS — E088 Diabetes mellitus due to underlying condition with unspecified complications: Secondary | ICD-10-CM | POA: Diagnosis not present

## 2017-01-14 DIAGNOSIS — E785 Hyperlipidemia, unspecified: Secondary | ICD-10-CM | POA: Diagnosis not present

## 2017-01-30 DIAGNOSIS — Z9221 Personal history of antineoplastic chemotherapy: Secondary | ICD-10-CM | POA: Diagnosis not present

## 2017-01-30 DIAGNOSIS — Z9011 Acquired absence of right breast and nipple: Secondary | ICD-10-CM | POA: Diagnosis not present

## 2017-01-30 DIAGNOSIS — Z171 Estrogen receptor negative status [ER-]: Secondary | ICD-10-CM | POA: Diagnosis not present

## 2017-01-30 DIAGNOSIS — Z923 Personal history of irradiation: Secondary | ICD-10-CM | POA: Diagnosis not present

## 2017-01-30 DIAGNOSIS — Z853 Personal history of malignant neoplasm of breast: Secondary | ICD-10-CM | POA: Diagnosis not present

## 2017-02-11 DIAGNOSIS — I10 Essential (primary) hypertension: Secondary | ICD-10-CM | POA: Diagnosis not present

## 2017-02-11 DIAGNOSIS — I2510 Atherosclerotic heart disease of native coronary artery without angina pectoris: Secondary | ICD-10-CM | POA: Diagnosis not present

## 2017-02-11 DIAGNOSIS — E785 Hyperlipidemia, unspecified: Secondary | ICD-10-CM | POA: Diagnosis not present

## 2017-02-11 DIAGNOSIS — E088 Diabetes mellitus due to underlying condition with unspecified complications: Secondary | ICD-10-CM | POA: Diagnosis not present

## 2017-03-01 DIAGNOSIS — E785 Hyperlipidemia, unspecified: Secondary | ICD-10-CM | POA: Diagnosis not present

## 2017-03-25 DIAGNOSIS — E119 Type 2 diabetes mellitus without complications: Secondary | ICD-10-CM | POA: Diagnosis not present

## 2017-03-25 DIAGNOSIS — Z79899 Other long term (current) drug therapy: Secondary | ICD-10-CM | POA: Diagnosis not present

## 2017-03-25 DIAGNOSIS — I10 Essential (primary) hypertension: Secondary | ICD-10-CM | POA: Diagnosis not present

## 2017-03-25 DIAGNOSIS — E7800 Pure hypercholesterolemia, unspecified: Secondary | ICD-10-CM | POA: Diagnosis not present

## 2017-03-25 DIAGNOSIS — M1712 Unilateral primary osteoarthritis, left knee: Secondary | ICD-10-CM | POA: Diagnosis not present

## 2017-04-01 DIAGNOSIS — M25562 Pain in left knee: Secondary | ICD-10-CM | POA: Diagnosis not present

## 2017-04-01 DIAGNOSIS — M1712 Unilateral primary osteoarthritis, left knee: Secondary | ICD-10-CM | POA: Diagnosis not present

## 2017-04-01 DIAGNOSIS — H524 Presbyopia: Secondary | ICD-10-CM | POA: Diagnosis not present

## 2017-04-02 DIAGNOSIS — M179 Osteoarthritis of knee, unspecified: Secondary | ICD-10-CM | POA: Diagnosis not present

## 2017-04-02 DIAGNOSIS — M25562 Pain in left knee: Secondary | ICD-10-CM | POA: Diagnosis not present

## 2017-04-03 DIAGNOSIS — R2689 Other abnormalities of gait and mobility: Secondary | ICD-10-CM | POA: Diagnosis not present

## 2017-04-03 DIAGNOSIS — M2560 Stiffness of unspecified joint, not elsewhere classified: Secondary | ICD-10-CM | POA: Diagnosis not present

## 2017-04-03 DIAGNOSIS — M25562 Pain in left knee: Secondary | ICD-10-CM | POA: Diagnosis not present

## 2017-04-05 DIAGNOSIS — M2560 Stiffness of unspecified joint, not elsewhere classified: Secondary | ICD-10-CM | POA: Diagnosis not present

## 2017-04-05 DIAGNOSIS — R2689 Other abnormalities of gait and mobility: Secondary | ICD-10-CM | POA: Diagnosis not present

## 2017-04-05 DIAGNOSIS — M25562 Pain in left knee: Secondary | ICD-10-CM | POA: Diagnosis not present

## 2017-04-10 DIAGNOSIS — M25562 Pain in left knee: Secondary | ICD-10-CM | POA: Diagnosis not present

## 2017-04-10 DIAGNOSIS — M2560 Stiffness of unspecified joint, not elsewhere classified: Secondary | ICD-10-CM | POA: Diagnosis not present

## 2017-04-10 DIAGNOSIS — R2689 Other abnormalities of gait and mobility: Secondary | ICD-10-CM | POA: Diagnosis not present

## 2017-04-16 DIAGNOSIS — M2560 Stiffness of unspecified joint, not elsewhere classified: Secondary | ICD-10-CM | POA: Diagnosis not present

## 2017-04-16 DIAGNOSIS — R2689 Other abnormalities of gait and mobility: Secondary | ICD-10-CM | POA: Diagnosis not present

## 2017-04-16 DIAGNOSIS — M25562 Pain in left knee: Secondary | ICD-10-CM | POA: Diagnosis not present

## 2017-04-23 DIAGNOSIS — Z853 Personal history of malignant neoplasm of breast: Secondary | ICD-10-CM | POA: Diagnosis not present

## 2017-04-23 DIAGNOSIS — Z1231 Encounter for screening mammogram for malignant neoplasm of breast: Secondary | ICD-10-CM | POA: Diagnosis not present

## 2017-04-24 DIAGNOSIS — M25562 Pain in left knee: Secondary | ICD-10-CM | POA: Diagnosis not present

## 2017-04-24 DIAGNOSIS — M2560 Stiffness of unspecified joint, not elsewhere classified: Secondary | ICD-10-CM | POA: Diagnosis not present

## 2017-04-24 DIAGNOSIS — R2689 Other abnormalities of gait and mobility: Secondary | ICD-10-CM | POA: Diagnosis not present

## 2017-05-06 DIAGNOSIS — Z6834 Body mass index (BMI) 34.0-34.9, adult: Secondary | ICD-10-CM | POA: Diagnosis not present

## 2017-05-06 DIAGNOSIS — C773 Secondary and unspecified malignant neoplasm of axilla and upper limb lymph nodes: Secondary | ICD-10-CM | POA: Diagnosis not present

## 2017-05-06 DIAGNOSIS — E669 Obesity, unspecified: Secondary | ICD-10-CM | POA: Diagnosis not present

## 2017-05-06 DIAGNOSIS — C50911 Malignant neoplasm of unspecified site of right female breast: Secondary | ICD-10-CM | POA: Diagnosis not present

## 2017-06-10 DIAGNOSIS — Z9221 Personal history of antineoplastic chemotherapy: Secondary | ICD-10-CM | POA: Diagnosis not present

## 2017-06-10 DIAGNOSIS — Z923 Personal history of irradiation: Secondary | ICD-10-CM | POA: Diagnosis not present

## 2017-06-10 DIAGNOSIS — Z853 Personal history of malignant neoplasm of breast: Secondary | ICD-10-CM | POA: Diagnosis not present

## 2017-06-10 DIAGNOSIS — Z171 Estrogen receptor negative status [ER-]: Secondary | ICD-10-CM | POA: Diagnosis not present

## 2017-06-10 DIAGNOSIS — Z9011 Acquired absence of right breast and nipple: Secondary | ICD-10-CM | POA: Diagnosis not present

## 2017-06-24 DIAGNOSIS — Z90710 Acquired absence of both cervix and uterus: Secondary | ICD-10-CM | POA: Diagnosis not present

## 2017-06-24 DIAGNOSIS — E669 Obesity, unspecified: Secondary | ICD-10-CM | POA: Diagnosis not present

## 2017-06-24 DIAGNOSIS — I10 Essential (primary) hypertension: Secondary | ICD-10-CM | POA: Diagnosis not present

## 2017-06-24 DIAGNOSIS — E119 Type 2 diabetes mellitus without complications: Secondary | ICD-10-CM | POA: Diagnosis not present

## 2017-06-24 DIAGNOSIS — Z0000 Encounter for general adult medical examination without abnormal findings: Secondary | ICD-10-CM | POA: Diagnosis not present

## 2017-06-24 DIAGNOSIS — E7800 Pure hypercholesterolemia, unspecified: Secondary | ICD-10-CM | POA: Diagnosis not present

## 2017-06-24 DIAGNOSIS — Z6834 Body mass index (BMI) 34.0-34.9, adult: Secondary | ICD-10-CM | POA: Diagnosis not present

## 2017-09-09 DIAGNOSIS — Z23 Encounter for immunization: Secondary | ICD-10-CM | POA: Diagnosis not present

## 2017-10-03 DIAGNOSIS — I10 Essential (primary) hypertension: Secondary | ICD-10-CM | POA: Diagnosis not present

## 2017-10-03 DIAGNOSIS — E7800 Pure hypercholesterolemia, unspecified: Secondary | ICD-10-CM | POA: Diagnosis not present

## 2017-10-03 DIAGNOSIS — E119 Type 2 diabetes mellitus without complications: Secondary | ICD-10-CM | POA: Diagnosis not present

## 2017-10-15 ENCOUNTER — Other Ambulatory Visit: Payer: Self-pay

## 2017-10-21 DIAGNOSIS — Z923 Personal history of irradiation: Secondary | ICD-10-CM | POA: Diagnosis not present

## 2017-10-21 DIAGNOSIS — Z9011 Acquired absence of right breast and nipple: Secondary | ICD-10-CM | POA: Diagnosis not present

## 2017-10-21 DIAGNOSIS — Z9221 Personal history of antineoplastic chemotherapy: Secondary | ICD-10-CM | POA: Diagnosis not present

## 2017-10-21 DIAGNOSIS — Z171 Estrogen receptor negative status [ER-]: Secondary | ICD-10-CM | POA: Diagnosis not present

## 2017-10-21 DIAGNOSIS — Z853 Personal history of malignant neoplasm of breast: Secondary | ICD-10-CM | POA: Diagnosis not present

## 2017-10-22 ENCOUNTER — Encounter: Payer: Self-pay | Admitting: Cardiology

## 2017-10-22 ENCOUNTER — Ambulatory Visit: Payer: Medicare HMO | Admitting: Cardiology

## 2017-10-22 VITALS — BP 120/74 | HR 70 | Ht 64.0 in | Wt 192.0 lb

## 2017-10-22 DIAGNOSIS — I2510 Atherosclerotic heart disease of native coronary artery without angina pectoris: Secondary | ICD-10-CM | POA: Diagnosis not present

## 2017-10-22 DIAGNOSIS — E785 Hyperlipidemia, unspecified: Secondary | ICD-10-CM

## 2017-10-22 DIAGNOSIS — I10 Essential (primary) hypertension: Secondary | ICD-10-CM | POA: Diagnosis not present

## 2017-10-22 DIAGNOSIS — E088 Diabetes mellitus due to underlying condition with unspecified complications: Secondary | ICD-10-CM | POA: Diagnosis not present

## 2017-10-22 NOTE — Patient Instructions (Signed)
Medication Instructions:  Your physician recommends that you continue on your current medications as directed. Please refer to the Current Medication list given to you today.   Labwork: None  Testing/Procedures: None  Follow-Up: Your physician recommends that you schedule a follow-up appointment in: September, 2019   Any Other Special Instructions Will Be Listed Below (If Applicable).     If you need a refill on your cardiac medications before your next appointment, please call your pharmacy.   [place], RN, BSN

## 2017-10-22 NOTE — Progress Notes (Signed)
Cardiology Office Note:    Date:  10/22/2017   ID:  Tanya Ayers, DOB Aug 14, 1946, MRN [id number]  PCP:  Greig Right, MD  Cardiologist:  Jenean Lindau, MD   Referring MD: Greig Right, MD    ASSESSMENT:    1. Coronary artery disease involving native coronary artery of native heart without angina pectoris   2. Essential hypertension   3. Diabetes mellitus due to underlying condition with complication, without long-term current use of insulin ([place])   4. Dyslipidemia    PLAN:    In order of problems listed above:  1. Secondary prevention stressed to the patient.  Importance of compliance with diet and medications stressed and she vocalized understanding. 2. Her exercise tolerance and exercise protocol is excellent and encouraged her to continue it. 3. Her blood pressures and lipids are fine.  I reviewed them and questions were answered to her satisfaction.  Lipids will be followed by her primary physician.  She will be seen in follow-up appointment in about 10 months or earlier if she has any concerns.   Medication Adjustments/Labs and Tests Ordered: Current medicines are reviewed at length with the patient today.  Concerns regarding medicines are outlined above.  No orders of the defined types were placed in this encounter.  No orders of the defined types were placed in this encounter.    History of Present Illness:    Tanya Ayers is a 70 y.o. female who is being seen today for the evaluation of coronary arterydisease at the request of Greig Right, MD.  Patient is a pleasant 71 year old female.  She has past medical history of coronary artery disease, essential hypertension and dyslipidemia.  She is a breast cancer survivor.  She mentions to me that she exercises on a regular basis and goes to cardiac rehab at [facility].  No chest pain orthopnea or PND.  She is very meticulously trying to lose weight.  At the time of my evaluation she is alert awake  oriented and in no distress.  Again she does not have any symptoms from a cardiovascular standpoint and is happy with her quality of life.  Past Medical History:  Diagnosis Date  . Hypertension     History reviewed. No pertinent surgical history.  Current Medications: Current Meds  Medication Sig  . Amino Acids (DAILY AMINO 6000 PO) Take 20 mg by mouth daily.  Marland Kitchen aspirin (GOODSENSE ASPIRIN) 325 MG tablet Take 325 mg by mouth daily.  . carvedilol (COREG) 25 MG tablet Take 25 mg by mouth daily.   . Cholecalciferol (VITAMIN D3) 1000 units CAPS Take by mouth.  . DOCOSAHEXAENOIC ACID PO Take by mouth.  . meloxicam (MOBIC) 15 MG tablet Take 15 mg by mouth daily as needed.  . metFORMIN (GLUCOPHAGE-XR) 500 MG 24 hr tablet Take 500 mg by mouth daily.  . Multiple Vitamin (MULTIVITAMIN) capsule Take by mouth.  . nitroGLYCERIN (NITROSTAT) 0.4 MG SL tablet Place 0.4 mg under the tongue every 5 (five) minutes as needed.  . pravastatin (PRAVACHOL) 40 MG tablet Take 40 mg by mouth daily.  . valsartan-hydrochlorothiazide (DIOVAN-HCT) 320-25 MG tablet Take 1 tablet by mouth daily.     Allergies:   Atorvastatin; Latex; Rosuvastatin; and Tape   Social History   Socioeconomic History  . Marital status: Married    Spouse name: None  . Number of children: None  . Years of education: None  . Highest education level: None  Social Needs  . Financial resource strain:  None  . Food insecurity - worry: None  . Food insecurity - inability: None  . Transportation needs - medical: None  . Transportation needs - non-medical: None  Occupational History  . None  Tobacco Use  . Smoking status: Never Smoker  . Smokeless tobacco: Never Used  Substance and Sexual Activity  . Alcohol use: No    Frequency: Never  . Drug use: No  . Sexual activity: None  Other Topics Concern  . None  Social History Narrative  . None     Family History: The patient's family history includes Clotting disorder in her  father; Heart attack in her mother; Heart disease in her father.  ROS:   Please see the history of present illness.    All other systems reviewed and are negative.  EKGs/Labs/Other Studies Reviewed:    The following studies were reviewed today: I reviewed records from primary care physician office including lipids at extensive length.  I discussed my findings with her and questions were answered to her satisfaction.   Recent Labs: No results found for requested labs within last 8760 hours.  Recent Lipid Panel No results found for: CHOL, TRIG, HDL, CHOLHDL, VLDL, LDLCALC, LDLDIRECT  Physical Exam:    VS:  BP 120/74   Pulse 70   Ht 5\' 4"  (1.626 m)   Wt 192 lb (87.1 kg)   SpO2 98%   BMI 32.96 kg/m     Wt Readings from Last 3 Encounters:  10/22/17 192 lb (87.1 kg)     GEN: Patient is in no acute distress HEENT: Normal NECK: No JVD; No carotid bruits LYMPHATICS: No lymphadenopathy CARDIAC: S1 S2 regular, 2/6 systolic murmur at the apex. RESPIRATORY:  Clear to auscultation without rales, wheezing or rhonchi  ABDOMEN: Soft, non-tender, non-distended MUSCULOSKELETAL:  No edema; No deformity  SKIN: Warm and dry NEUROLOGIC:  Alert and oriented x 3 PSYCHIATRIC:  Normal affect    Signed, Jenean Lindau, MD  10/22/2017 10:45 AM    [place]

## 2018-01-31 DIAGNOSIS — E7800 Pure hypercholesterolemia, unspecified: Secondary | ICD-10-CM | POA: Diagnosis not present

## 2018-01-31 DIAGNOSIS — E119 Type 2 diabetes mellitus without complications: Secondary | ICD-10-CM | POA: Diagnosis not present

## 2018-01-31 DIAGNOSIS — I10 Essential (primary) hypertension: Secondary | ICD-10-CM | POA: Diagnosis not present

## 2018-03-07 ENCOUNTER — Encounter: Payer: Self-pay | Admitting: Gastroenterology

## 2018-04-25 DIAGNOSIS — Z1231 Encounter for screening mammogram for malignant neoplasm of breast: Secondary | ICD-10-CM | POA: Diagnosis not present

## 2018-05-01 DIAGNOSIS — Z9011 Acquired absence of right breast and nipple: Secondary | ICD-10-CM | POA: Diagnosis not present

## 2018-05-01 DIAGNOSIS — Z853 Personal history of malignant neoplasm of breast: Secondary | ICD-10-CM | POA: Diagnosis not present

## 2018-05-01 DIAGNOSIS — Z923 Personal history of irradiation: Secondary | ICD-10-CM | POA: Diagnosis not present

## 2018-05-01 DIAGNOSIS — Z171 Estrogen receptor negative status [ER-]: Secondary | ICD-10-CM | POA: Diagnosis not present

## 2018-05-01 DIAGNOSIS — Z9221 Personal history of antineoplastic chemotherapy: Secondary | ICD-10-CM | POA: Diagnosis not present

## 2018-05-05 DIAGNOSIS — Z1231 Encounter for screening mammogram for malignant neoplasm of breast: Secondary | ICD-10-CM | POA: Diagnosis not present

## 2018-05-05 DIAGNOSIS — Z1211 Encounter for screening for malignant neoplasm of colon: Secondary | ICD-10-CM | POA: Diagnosis not present

## 2018-05-05 DIAGNOSIS — C50911 Malignant neoplasm of unspecified site of right female breast: Secondary | ICD-10-CM | POA: Diagnosis not present

## 2018-05-05 DIAGNOSIS — Z6831 Body mass index (BMI) 31.0-31.9, adult: Secondary | ICD-10-CM | POA: Diagnosis not present

## 2018-05-05 DIAGNOSIS — C773 Secondary and unspecified malignant neoplasm of axilla and upper limb lymph nodes: Secondary | ICD-10-CM | POA: Diagnosis not present

## 2018-05-08 DIAGNOSIS — Z1211 Encounter for screening for malignant neoplasm of colon: Secondary | ICD-10-CM | POA: Diagnosis not present

## 2018-05-08 DIAGNOSIS — I10 Essential (primary) hypertension: Secondary | ICD-10-CM | POA: Diagnosis not present

## 2018-05-08 DIAGNOSIS — Z7982 Long term (current) use of aspirin: Secondary | ICD-10-CM | POA: Diagnosis not present

## 2018-05-08 DIAGNOSIS — E119 Type 2 diabetes mellitus without complications: Secondary | ICD-10-CM | POA: Diagnosis not present

## 2018-05-08 DIAGNOSIS — K5730 Diverticulosis of large intestine without perforation or abscess without bleeding: Secondary | ICD-10-CM | POA: Diagnosis not present

## 2018-05-08 DIAGNOSIS — Z853 Personal history of malignant neoplasm of breast: Secondary | ICD-10-CM | POA: Diagnosis not present

## 2018-05-08 DIAGNOSIS — Z79899 Other long term (current) drug therapy: Secondary | ICD-10-CM | POA: Diagnosis not present

## 2018-05-08 DIAGNOSIS — E7800 Pure hypercholesterolemia, unspecified: Secondary | ICD-10-CM | POA: Diagnosis not present

## 2018-05-08 DIAGNOSIS — Z8371 Family history of colonic polyps: Secondary | ICD-10-CM | POA: Diagnosis not present

## 2018-05-08 DIAGNOSIS — Z1212 Encounter for screening for malignant neoplasm of rectum: Secondary | ICD-10-CM | POA: Diagnosis not present

## 2018-05-19 DIAGNOSIS — L219 Seborrheic dermatitis, unspecified: Secondary | ICD-10-CM | POA: Diagnosis not present

## 2018-06-15 DIAGNOSIS — H524 Presbyopia: Secondary | ICD-10-CM | POA: Diagnosis not present

## 2018-06-25 DIAGNOSIS — E7800 Pure hypercholesterolemia, unspecified: Secondary | ICD-10-CM | POA: Diagnosis not present

## 2018-06-25 DIAGNOSIS — E669 Obesity, unspecified: Secondary | ICD-10-CM | POA: Diagnosis not present

## 2018-06-25 DIAGNOSIS — I2510 Atherosclerotic heart disease of native coronary artery without angina pectoris: Secondary | ICD-10-CM | POA: Diagnosis not present

## 2018-06-25 DIAGNOSIS — K219 Gastro-esophageal reflux disease without esophagitis: Secondary | ICD-10-CM | POA: Diagnosis not present

## 2018-06-25 DIAGNOSIS — E1169 Type 2 diabetes mellitus with other specified complication: Secondary | ICD-10-CM | POA: Diagnosis not present

## 2018-06-25 DIAGNOSIS — M859 Disorder of bone density and structure, unspecified: Secondary | ICD-10-CM | POA: Diagnosis not present

## 2018-06-25 DIAGNOSIS — Z79899 Other long term (current) drug therapy: Secondary | ICD-10-CM | POA: Diagnosis not present

## 2018-06-25 DIAGNOSIS — R69 Illness, unspecified: Secondary | ICD-10-CM | POA: Diagnosis not present

## 2018-06-25 DIAGNOSIS — I10 Essential (primary) hypertension: Secondary | ICD-10-CM | POA: Diagnosis not present

## 2018-06-25 DIAGNOSIS — Z0000 Encounter for general adult medical examination without abnormal findings: Secondary | ICD-10-CM | POA: Diagnosis not present

## 2018-06-25 DIAGNOSIS — Z6833 Body mass index (BMI) 33.0-33.9, adult: Secondary | ICD-10-CM | POA: Diagnosis not present

## 2018-08-13 DIAGNOSIS — M8580 Other specified disorders of bone density and structure, unspecified site: Secondary | ICD-10-CM | POA: Diagnosis not present

## 2018-08-13 DIAGNOSIS — M85852 Other specified disorders of bone density and structure, left thigh: Secondary | ICD-10-CM | POA: Diagnosis not present

## 2018-08-19 ENCOUNTER — Ambulatory Visit: Payer: Medicare HMO | Admitting: Cardiology

## 2018-08-19 ENCOUNTER — Encounter: Payer: Self-pay | Admitting: Cardiology

## 2018-08-19 VITALS — BP 116/68 | HR 73 | Ht 64.0 in | Wt 186.0 lb

## 2018-08-19 DIAGNOSIS — E088 Diabetes mellitus due to underlying condition with unspecified complications: Secondary | ICD-10-CM

## 2018-08-19 DIAGNOSIS — E785 Hyperlipidemia, unspecified: Secondary | ICD-10-CM

## 2018-08-19 DIAGNOSIS — I2510 Atherosclerotic heart disease of native coronary artery without angina pectoris: Secondary | ICD-10-CM

## 2018-08-19 DIAGNOSIS — I10 Essential (primary) hypertension: Secondary | ICD-10-CM

## 2018-08-19 NOTE — Patient Instructions (Signed)
Medication Instructions:  Your physician recommends that you continue on your current medications as directed. Please refer to the Current Medication list given to you today.  Labwork: None  Testing/Procedures: None  Follow-Up: Your physician recommends that you schedule a follow-up appointment in: 6 months  Any Other Special Instructions Will Be Listed Below (If Applicable).     If you need a refill on your cardiac medications before your next appointment, please call your pharmacy.   [facility]  Blaire Palomino A, RN, BSN  

## 2018-08-19 NOTE — Progress Notes (Signed)
Cardiology Office Note:    Date:  08/19/2018   ID:  [name], DOB [birth year], MRN [id number]  PCP:  Greig Right, MD  Cardiologist:  Jenean Lindau, MD   Referring MD: Greig Right, MD    ASSESSMENT:    1. Coronary artery disease involving native coronary artery of native heart without angina pectoris   2. Essential hypertension   3. Diabetes mellitus due to underlying condition with complication, without long-term current use of insulin ([place])   4. Dyslipidemia    PLAN:    In order of problems listed above:  1. Secondary prevention stressed with the patient.  Importance of compliance with diet and medication stressed and she vocalized understanding.  Her blood pressure is stable.  Diet was discussed with dyslipidemia.  Also discussed diet for diabetes mellitus and obesity.  She vocalized understanding.  Weight reduction was stressed. 2. I reviewed her lipids that were available to me today. 3. Patient will be seen in follow-up appointment in 6 months or earlier if the patient has any concerns    Medication Adjustments/Labs and Tests Ordered: Current medicines are reviewed at length with the patient today.  Concerns regarding medicines are outlined above.  No orders of the defined types were placed in this encounter.  No orders of the defined types were placed in this encounter.    No chief complaint on file.    History of Present Illness:    Tanya Ayers is a 72 y.o. female.  Patient has known coronary artery disease.  She has essential hypertension diabetes mellitus and dyslipidemia.  She denies any problems at this time and takes care of activities of daily living.  No chest pain orthopnea or PND.  At the time of my evaluation, the patient is alert awake oriented and in no distress.  Past Medical History:  Diagnosis Date  . Hypertension     History reviewed. No pertinent surgical history.  Current Medications: Current Meds  Medication Sig  .  Amino Acids (DAILY AMINO 6000 PO) Take 20 mg by mouth daily.  Marland Kitchen aspirin (GOODSENSE ASPIRIN) 325 MG tablet Take 325 mg by mouth daily.  . carvedilol (COREG) 25 MG tablet Take 25 mg by mouth daily.   . Cholecalciferol (VITAMIN D3) 1000 units CAPS Take 1 capsule by mouth daily.   Marland Kitchen Co-Enzyme Q-10 30 MG CAPS Take 1 capsule by mouth 3 (three) times daily.  . DOCOSAHEXAENOIC ACID PO Take 1 capsule by mouth daily.   . meloxicam (MOBIC) 15 MG tablet Take 15 mg by mouth daily as needed.  . metFORMIN (GLUCOPHAGE-XR) 500 MG 24 hr tablet Take 500 mg by mouth daily.  . Multiple Vitamin (MULTIVITAMIN) capsule Take 1 capsule by mouth daily.   . nitroGLYCERIN (NITROSTAT) 0.4 MG SL tablet Place 0.4 mg under the tongue every 5 (five) minutes as needed.  . pravastatin (PRAVACHOL) 40 MG tablet Take 40 mg by mouth daily.  . ranitidine (ZANTAC) 150 MG tablet Take 150 mg by mouth 2 (two) times daily.  . valsartan-hydrochlorothiazide (DIOVAN-HCT) 320-25 MG tablet Take 1 tablet by mouth daily.     Allergies:   Atorvastatin; Latex; Rosuvastatin; and Tape   Social History   Socioeconomic History  . Marital status: Married    Spouse name: Not on file  . Number of children: Not on file  . Years of education: Not on file  . Highest education level: Not on file  Occupational History  . Not on file  Social Needs  .  Financial resource strain: Not on file  . Food insecurity:    Worry: Not on file    Inability: Not on file  . Transportation needs:    Medical: Not on file    Non-medical: Not on file  Tobacco Use  . Smoking status: Never Smoker  . Smokeless tobacco: Never Used  Substance and Sexual Activity  . Alcohol use: No    Frequency: Never  . Drug use: No  . Sexual activity: Not on file  Lifestyle  . Physical activity:    Days per week: Not on file    Minutes per session: Not on file  . Stress: Not on file  Relationships  . Social connections:    Talks on phone: Not on file    Gets together: Not  on file    Attends religious service: Not on file    Active member of club or organization: Not on file    Attends meetings of clubs or organizations: Not on file    Relationship status: Not on file  Other Topics Concern  . Not on file  Social History Narrative  . Not on file     Family History: The patient's family history includes Clotting disorder in her father; Heart attack in her mother; Heart disease in her father.  ROS:   Please see the history of present illness.    All other systems reviewed and are negative.  EKGs/Labs/Other Studies Reviewed:    The following studies were reviewed today: I discussed my findings with the patient at extensive length.   Recent Labs: No results found for requested labs within last 8760 hours.  Recent Lipid Panel No results found for: CHOL, TRIG, HDL, CHOLHDL, VLDL, LDLCALC, LDLDIRECT  Physical Exam:    VS:  BP 116/68 (BP Location: Right Arm, Patient Position: Sitting, Cuff Size: Normal)   Pulse 73   Ht 5\' 4"  (1.626 m)   Wt 186 lb (84.4 kg)   SpO2 98%   BMI 31.93 kg/m     Wt Readings from Last 3 Encounters:  08/19/18 186 lb (84.4 kg)  10/22/17 192 lb (87.1 kg)     GEN: Patient is in no acute distress HEENT: Normal NECK: No JVD; No carotid bruits LYMPHATICS: No lymphadenopathy CARDIAC: Hear sounds regular, 2/6 systolic murmur at the apex. RESPIRATORY:  Clear to auscultation without rales, wheezing or rhonchi  ABDOMEN: Soft, non-tender, non-distended MUSCULOSKELETAL:  No edema; No deformity  SKIN: Warm and dry NEUROLOGIC:  Alert and oriented x 3 PSYCHIATRIC:  Normal affect   Signed, Jenean Lindau, MD  08/19/2018 8:57 AM    [place]

## 2018-08-29 DIAGNOSIS — E7800 Pure hypercholesterolemia, unspecified: Secondary | ICD-10-CM | POA: Diagnosis not present

## 2018-08-29 DIAGNOSIS — Z6833 Body mass index (BMI) 33.0-33.9, adult: Secondary | ICD-10-CM | POA: Diagnosis not present

## 2018-08-29 DIAGNOSIS — I10 Essential (primary) hypertension: Secondary | ICD-10-CM | POA: Diagnosis not present

## 2018-08-29 DIAGNOSIS — E669 Obesity, unspecified: Secondary | ICD-10-CM | POA: Diagnosis not present

## 2018-08-29 DIAGNOSIS — K219 Gastro-esophageal reflux disease without esophagitis: Secondary | ICD-10-CM | POA: Diagnosis not present

## 2018-08-29 DIAGNOSIS — E1169 Type 2 diabetes mellitus with other specified complication: Secondary | ICD-10-CM | POA: Diagnosis not present

## 2018-09-20 DIAGNOSIS — R69 Illness, unspecified: Secondary | ICD-10-CM | POA: Diagnosis not present

## 2018-09-21 DIAGNOSIS — R69 Illness, unspecified: Secondary | ICD-10-CM | POA: Diagnosis not present

## 2018-09-22 DIAGNOSIS — Z23 Encounter for immunization: Secondary | ICD-10-CM | POA: Diagnosis not present

## 2018-10-30 DIAGNOSIS — Z9221 Personal history of antineoplastic chemotherapy: Secondary | ICD-10-CM | POA: Diagnosis not present

## 2018-10-30 DIAGNOSIS — Z923 Personal history of irradiation: Secondary | ICD-10-CM | POA: Diagnosis not present

## 2018-10-30 DIAGNOSIS — Z9011 Acquired absence of right breast and nipple: Secondary | ICD-10-CM | POA: Diagnosis not present

## 2018-10-30 DIAGNOSIS — Z853 Personal history of malignant neoplasm of breast: Secondary | ICD-10-CM | POA: Diagnosis not present

## 2018-11-27 DIAGNOSIS — E1169 Type 2 diabetes mellitus with other specified complication: Secondary | ICD-10-CM | POA: Diagnosis not present

## 2018-11-27 DIAGNOSIS — I10 Essential (primary) hypertension: Secondary | ICD-10-CM | POA: Diagnosis not present

## 2018-11-27 DIAGNOSIS — E7800 Pure hypercholesterolemia, unspecified: Secondary | ICD-10-CM | POA: Diagnosis not present

## 2018-11-27 DIAGNOSIS — Z6834 Body mass index (BMI) 34.0-34.9, adult: Secondary | ICD-10-CM | POA: Diagnosis not present

## 2018-11-27 DIAGNOSIS — E669 Obesity, unspecified: Secondary | ICD-10-CM | POA: Diagnosis not present

## 2018-12-16 DIAGNOSIS — R69 Illness, unspecified: Secondary | ICD-10-CM | POA: Diagnosis not present

## 2019-01-28 DIAGNOSIS — D492 Neoplasm of unspecified behavior of bone, soft tissue, and skin: Secondary | ICD-10-CM | POA: Diagnosis not present

## 2019-02-05 DIAGNOSIS — J342 Deviated nasal septum: Secondary | ICD-10-CM | POA: Diagnosis not present

## 2019-02-05 DIAGNOSIS — D3702 Neoplasm of uncertain behavior of tongue: Secondary | ICD-10-CM | POA: Diagnosis not present

## 2019-02-05 DIAGNOSIS — R682 Dry mouth, unspecified: Secondary | ICD-10-CM | POA: Diagnosis not present

## 2019-02-05 DIAGNOSIS — Z853 Personal history of malignant neoplasm of breast: Secondary | ICD-10-CM | POA: Diagnosis not present

## 2019-02-25 DIAGNOSIS — K148 Other diseases of tongue: Secondary | ICD-10-CM | POA: Diagnosis not present

## 2019-02-25 DIAGNOSIS — D490 Neoplasm of unspecified behavior of digestive system: Secondary | ICD-10-CM | POA: Diagnosis not present

## 2019-03-30 DIAGNOSIS — E1169 Type 2 diabetes mellitus with other specified complication: Secondary | ICD-10-CM | POA: Diagnosis not present

## 2019-03-30 DIAGNOSIS — E7800 Pure hypercholesterolemia, unspecified: Secondary | ICD-10-CM | POA: Diagnosis not present

## 2019-03-30 DIAGNOSIS — I10 Essential (primary) hypertension: Secondary | ICD-10-CM | POA: Diagnosis not present

## 2019-03-30 DIAGNOSIS — E669 Obesity, unspecified: Secondary | ICD-10-CM | POA: Diagnosis not present

## 2019-04-29 DIAGNOSIS — Z9011 Acquired absence of right breast and nipple: Secondary | ICD-10-CM | POA: Diagnosis not present

## 2019-04-29 DIAGNOSIS — Z1231 Encounter for screening mammogram for malignant neoplasm of breast: Secondary | ICD-10-CM | POA: Diagnosis not present

## 2019-05-01 DIAGNOSIS — Z853 Personal history of malignant neoplasm of breast: Secondary | ICD-10-CM | POA: Diagnosis not present

## 2019-05-19 DIAGNOSIS — Z6832 Body mass index (BMI) 32.0-32.9, adult: Secondary | ICD-10-CM | POA: Diagnosis not present

## 2019-05-19 DIAGNOSIS — C50911 Malignant neoplasm of unspecified site of right female breast: Secondary | ICD-10-CM | POA: Diagnosis not present

## 2019-05-19 DIAGNOSIS — C773 Secondary and unspecified malignant neoplasm of axilla and upper limb lymph nodes: Secondary | ICD-10-CM | POA: Diagnosis not present

## 2019-06-30 DIAGNOSIS — E1169 Type 2 diabetes mellitus with other specified complication: Secondary | ICD-10-CM | POA: Diagnosis not present

## 2019-06-30 DIAGNOSIS — Z0000 Encounter for general adult medical examination without abnormal findings: Secondary | ICD-10-CM | POA: Diagnosis not present

## 2019-06-30 DIAGNOSIS — E669 Obesity, unspecified: Secondary | ICD-10-CM | POA: Diagnosis not present

## 2019-06-30 DIAGNOSIS — I10 Essential (primary) hypertension: Secondary | ICD-10-CM | POA: Diagnosis not present

## 2019-06-30 DIAGNOSIS — K219 Gastro-esophageal reflux disease without esophagitis: Secondary | ICD-10-CM | POA: Diagnosis not present

## 2019-06-30 DIAGNOSIS — Z6835 Body mass index (BMI) 35.0-35.9, adult: Secondary | ICD-10-CM | POA: Diagnosis not present

## 2019-06-30 DIAGNOSIS — E7800 Pure hypercholesterolemia, unspecified: Secondary | ICD-10-CM | POA: Diagnosis not present

## 2019-07-01 DIAGNOSIS — H524 Presbyopia: Secondary | ICD-10-CM | POA: Diagnosis not present

## 2019-07-01 DIAGNOSIS — E119 Type 2 diabetes mellitus without complications: Secondary | ICD-10-CM | POA: Diagnosis not present

## 2019-09-09 DIAGNOSIS — Z23 Encounter for immunization: Secondary | ICD-10-CM | POA: Diagnosis not present

## 2019-10-30 DIAGNOSIS — E669 Obesity, unspecified: Secondary | ICD-10-CM | POA: Diagnosis not present

## 2019-10-30 DIAGNOSIS — E1169 Type 2 diabetes mellitus with other specified complication: Secondary | ICD-10-CM | POA: Diagnosis not present

## 2019-10-30 DIAGNOSIS — Z6835 Body mass index (BMI) 35.0-35.9, adult: Secondary | ICD-10-CM | POA: Diagnosis not present

## 2019-10-30 DIAGNOSIS — I10 Essential (primary) hypertension: Secondary | ICD-10-CM | POA: Diagnosis not present

## 2019-10-30 DIAGNOSIS — E7800 Pure hypercholesterolemia, unspecified: Secondary | ICD-10-CM | POA: Diagnosis not present

## 2019-11-02 DIAGNOSIS — Z853 Personal history of malignant neoplasm of breast: Secondary | ICD-10-CM

## 2020-02-09 DIAGNOSIS — R222 Localized swelling, mass and lump, trunk: Secondary | ICD-10-CM | POA: Diagnosis not present

## 2020-02-09 DIAGNOSIS — Z853 Personal history of malignant neoplasm of breast: Secondary | ICD-10-CM | POA: Diagnosis not present

## 2020-02-11 DIAGNOSIS — K8020 Calculus of gallbladder without cholecystitis without obstruction: Secondary | ICD-10-CM | POA: Diagnosis not present

## 2020-02-11 DIAGNOSIS — C50911 Malignant neoplasm of unspecified site of right female breast: Secondary | ICD-10-CM | POA: Diagnosis not present

## 2020-02-11 DIAGNOSIS — R229 Localized swelling, mass and lump, unspecified: Secondary | ICD-10-CM | POA: Diagnosis not present

## 2020-02-29 DIAGNOSIS — I10 Essential (primary) hypertension: Secondary | ICD-10-CM | POA: Diagnosis not present

## 2020-02-29 DIAGNOSIS — E1169 Type 2 diabetes mellitus with other specified complication: Secondary | ICD-10-CM | POA: Diagnosis not present

## 2020-04-29 DIAGNOSIS — Z1231 Encounter for screening mammogram for malignant neoplasm of breast: Secondary | ICD-10-CM | POA: Diagnosis not present

## 2020-05-02 DIAGNOSIS — C50911 Malignant neoplasm of unspecified site of right female breast: Secondary | ICD-10-CM | POA: Diagnosis not present

## 2020-05-02 DIAGNOSIS — Z853 Personal history of malignant neoplasm of breast: Secondary | ICD-10-CM | POA: Diagnosis not present

## 2020-05-04 ENCOUNTER — Ambulatory Visit: Payer: Medicare HMO | Admitting: Cardiology

## 2020-05-04 ENCOUNTER — Encounter: Payer: Self-pay | Admitting: Cardiology

## 2020-05-04 ENCOUNTER — Other Ambulatory Visit: Payer: Self-pay

## 2020-05-04 VITALS — BP 120/60 | HR 69 | Ht 64.0 in | Wt 193.6 lb

## 2020-05-04 DIAGNOSIS — E088 Diabetes mellitus due to underlying condition with unspecified complications: Secondary | ICD-10-CM | POA: Diagnosis not present

## 2020-05-04 DIAGNOSIS — I2510 Atherosclerotic heart disease of native coronary artery without angina pectoris: Secondary | ICD-10-CM | POA: Diagnosis not present

## 2020-05-04 DIAGNOSIS — E785 Hyperlipidemia, unspecified: Secondary | ICD-10-CM

## 2020-05-04 DIAGNOSIS — I10 Essential (primary) hypertension: Secondary | ICD-10-CM | POA: Diagnosis not present

## 2020-05-04 NOTE — Progress Notes (Signed)
Cardiology Office Note:    Date:  05/04/2020   ID:  Tanya Ayers, DOB 1946-10-11, MRN [id number]  PCP:  Greig Right, MD  Cardiologist:  Jenean Lindau, MD   Referring MD: Greig Right, MD    ASSESSMENT:    1. Coronary artery disease involving native coronary artery of native heart without angina pectoris   2. Essential hypertension   3. Diabetes mellitus due to underlying condition with unspecified complications ([place])   4. Dyslipidemia    PLAN:    In order of problems listed above:  1. Coronary artery disease: Secondary prevention stressed with the patient.  Importance of compliance with diet and medication stressed and she vocalized understanding.  I tried to motivate her to start walking on a regular basis and increase her effort tolerance and she is agreeable. 2. Essential hypertension: Blood pressure stable 3. Mixed dyslipidemia: Lipids were reviewed.  She will have all blood work done in August for her general physical and early and will send me a copy of those lab works and I will review them. 4. Diabetes mellitus: She is managing this fairly well.  I told her with compliance with diet and to reduce weight with exercise and she promises to do so. 5. Patient will be seen in follow-up appointment in 6 months or earlier if the patient has any concerns    Medication Adjustments/Labs and Tests Ordered: Current medicines are reviewed at length with the patient today.  Concerns regarding medicines are outlined above.  No orders of the defined types were placed in this encounter.  No orders of the defined types were placed in this encounter.    No chief complaint on file.    History of Present Illness:    Tanya Ayers is a 74 y.o. female.  Patient has past medical history of coronary artery disease essential hypertension dyslipidemia and diet-controlled diabetes mellitus.  She denies any problems at this time and takes care of activities of daily living.  No  chest pain orthopnea or PND.  She wants to start an exercise program.  At the time of my evaluation, the patient is alert awake oriented and in no distress.  Past Medical History:  Diagnosis Date  . Hypertension     No past surgical history on file.  Current Medications: Current Meds  Medication Sig  . Amino Acids (DAILY AMINO 6000 PO) Take 20 mg by mouth daily.  Marland Kitchen aspirin (GOODSENSE ASPIRIN) 325 MG tablet Take 325 mg by mouth daily.  . carvedilol (COREG) 25 MG tablet Take 25 mg by mouth daily.   . Cholecalciferol (VITAMIN D3) 1000 units CAPS Take 1 capsule by mouth daily.   Marland Kitchen [name] 30 MG CAPS Take 1 capsule by mouth 3 (three) times daily.  . DOCOSAHEXAENOIC ACID PO Take 1 capsule by mouth daily.   . meloxicam (MOBIC) 15 MG tablet Take 15 mg by mouth daily as needed.  . metFORMIN (GLUCOPHAGE-XR) 500 MG 24 hr tablet Take 500 mg by mouth daily.  . Multiple Vitamin (MULTIVITAMIN) capsule Take 1 capsule by mouth daily.   . nitroGLYCERIN (NITROSTAT) 0.4 MG SL tablet Place 0.4 mg under the tongue every 5 (five) minutes as needed.  . pravastatin (PRAVACHOL) 40 MG tablet Take 40 mg by mouth daily.  . ranitidine (ZANTAC) 150 MG tablet Take 150 mg by mouth 2 (two) times daily.  . valsartan-hydrochlorothiazide (DIOVAN-HCT) 320-25 MG tablet Take 1 tablet by mouth daily.     Allergies:   Atorvastatin,  Latex, Rosuvastatin, and Tape   Social History   Socioeconomic History  . Marital status: Married    Spouse name: Not on file  . Number of children: Not on file  . Years of education: Not on file  . Highest education level: Not on file  Occupational History  . Not on file  Tobacco Use  . Smoking status: Never Smoker  . Smokeless tobacco: Never Used  Substance and Sexual Activity  . Alcohol use: No  . Drug use: No  . Sexual activity: Not on file  Other Topics Concern  . Not on file  Social History Narrative  . Not on file   Social Determinants of Health   Financial  Resource Strain:   . Difficulty of Paying Living Expenses:   Food Insecurity:   . Worried About Charity fundraiser in the Last Year:   . Arboriculturist in the Last Year:   Transportation Needs:   . Film/video editor (Medical):   Marland Kitchen Lack of Transportation (Non-Medical):   Physical Activity:   . Days of Exercise per Week:   . Minutes of Exercise per Session:   Stress:   . Feeling of Stress :   Social Connections:   . Frequency of Communication with Friends and Family:   . Frequency of Social Gatherings with Friends and Family:   . Attends Religious Services:   . Active Member of Clubs or Organizations:   . Attends Archivist Meetings:   Marland Kitchen Marital Status:      Family History: The patient's family history includes Clotting disorder in her father; Heart attack in her mother; Heart disease in her father.  ROS:   Please see the history of present illness.    All other systems reviewed and are negative.  EKGs/Labs/Other Studies Reviewed:    The following studies were reviewed today: EKG reveals sinus rhythm and nonspecific ST-T changes   Recent Labs: No results found for requested labs within last 8760 hours.  Recent Lipid Panel No results found for: CHOL, TRIG, HDL, CHOLHDL, VLDL, LDLCALC, LDLDIRECT  Physical Exam:    VS:  BP 120/60   Pulse 69   Ht 5\' 4"  (1.626 m)   Wt 193 lb 9.6 oz (87.8 kg)   SpO2 94%   BMI 33.23 kg/m     Wt Readings from Last 3 Encounters:  05/04/20 193 lb 9.6 oz (87.8 kg)  08/19/18 186 lb (84.4 kg)  10/22/17 192 lb (87.1 kg)     GEN: Patient is in no acute distress HEENT: Normal NECK: No JVD; No carotid bruits LYMPHATICS: No lymphadenopathy CARDIAC: Hear sounds regular, 2/6 systolic murmur at the apex. RESPIRATORY:  Clear to auscultation without rales, wheezing or rhonchi  ABDOMEN: Soft, non-tender, non-distended MUSCULOSKELETAL:  No edema; No deformity  SKIN: Warm and dry NEUROLOGIC:  Alert and oriented x 3 PSYCHIATRIC:   Normal affect   Signed, Jenean Lindau, MD  05/04/2020 1:51 PM    [facility]

## 2020-05-04 NOTE — Patient Instructions (Signed)

## 2020-05-09 DIAGNOSIS — Z6832 Body mass index (BMI) 32.0-32.9, adult: Secondary | ICD-10-CM | POA: Diagnosis not present

## 2020-05-09 DIAGNOSIS — C50911 Malignant neoplasm of unspecified site of right female breast: Secondary | ICD-10-CM | POA: Diagnosis not present

## 2020-05-09 DIAGNOSIS — C773 Secondary and unspecified malignant neoplasm of axilla and upper limb lymph nodes: Secondary | ICD-10-CM | POA: Diagnosis not present

## 2020-05-11 DIAGNOSIS — I10 Essential (primary) hypertension: Secondary | ICD-10-CM | POA: Diagnosis not present

## 2020-05-11 DIAGNOSIS — E119 Type 2 diabetes mellitus without complications: Secondary | ICD-10-CM | POA: Diagnosis not present

## 2020-05-11 DIAGNOSIS — Z79899 Other long term (current) drug therapy: Secondary | ICD-10-CM | POA: Diagnosis not present

## 2020-05-11 DIAGNOSIS — E785 Hyperlipidemia, unspecified: Secondary | ICD-10-CM | POA: Diagnosis not present

## 2020-05-13 DIAGNOSIS — E119 Type 2 diabetes mellitus without complications: Secondary | ICD-10-CM | POA: Diagnosis not present

## 2020-05-13 DIAGNOSIS — I10 Essential (primary) hypertension: Secondary | ICD-10-CM | POA: Diagnosis not present

## 2020-05-13 DIAGNOSIS — E785 Hyperlipidemia, unspecified: Secondary | ICD-10-CM | POA: Diagnosis not present

## 2020-05-13 DIAGNOSIS — Z79899 Other long term (current) drug therapy: Secondary | ICD-10-CM | POA: Diagnosis not present

## 2020-05-16 DIAGNOSIS — E119 Type 2 diabetes mellitus without complications: Secondary | ICD-10-CM | POA: Diagnosis not present

## 2020-05-16 DIAGNOSIS — I10 Essential (primary) hypertension: Secondary | ICD-10-CM | POA: Diagnosis not present

## 2020-05-16 DIAGNOSIS — Z79899 Other long term (current) drug therapy: Secondary | ICD-10-CM | POA: Diagnosis not present

## 2020-05-16 DIAGNOSIS — E785 Hyperlipidemia, unspecified: Secondary | ICD-10-CM | POA: Diagnosis not present

## 2020-05-18 DIAGNOSIS — Z79899 Other long term (current) drug therapy: Secondary | ICD-10-CM | POA: Diagnosis not present

## 2020-05-18 DIAGNOSIS — E119 Type 2 diabetes mellitus without complications: Secondary | ICD-10-CM | POA: Diagnosis not present

## 2020-05-18 DIAGNOSIS — I10 Essential (primary) hypertension: Secondary | ICD-10-CM | POA: Diagnosis not present

## 2020-05-18 DIAGNOSIS — E785 Hyperlipidemia, unspecified: Secondary | ICD-10-CM | POA: Diagnosis not present

## 2020-06-24 DIAGNOSIS — E119 Type 2 diabetes mellitus without complications: Secondary | ICD-10-CM | POA: Diagnosis not present

## 2020-06-30 DIAGNOSIS — I10 Essential (primary) hypertension: Secondary | ICD-10-CM | POA: Diagnosis not present

## 2020-06-30 DIAGNOSIS — Z0000 Encounter for general adult medical examination without abnormal findings: Secondary | ICD-10-CM | POA: Diagnosis not present

## 2020-06-30 DIAGNOSIS — Z90710 Acquired absence of both cervix and uterus: Secondary | ICD-10-CM | POA: Diagnosis not present

## 2020-06-30 DIAGNOSIS — E1169 Type 2 diabetes mellitus with other specified complication: Secondary | ICD-10-CM | POA: Diagnosis not present

## 2020-06-30 DIAGNOSIS — I2510 Atherosclerotic heart disease of native coronary artery without angina pectoris: Secondary | ICD-10-CM | POA: Diagnosis not present

## 2020-06-30 DIAGNOSIS — E7800 Pure hypercholesterolemia, unspecified: Secondary | ICD-10-CM | POA: Diagnosis not present

## 2020-06-30 DIAGNOSIS — K219 Gastro-esophageal reflux disease without esophagitis: Secondary | ICD-10-CM | POA: Diagnosis not present

## 2020-06-30 DIAGNOSIS — Z853 Personal history of malignant neoplasm of breast: Secondary | ICD-10-CM | POA: Diagnosis not present

## 2020-08-12 DIAGNOSIS — J00 Acute nasopharyngitis [common cold]: Secondary | ICD-10-CM | POA: Diagnosis not present

## 2020-08-12 DIAGNOSIS — Z20822 Contact with and (suspected) exposure to covid-19: Secondary | ICD-10-CM | POA: Diagnosis not present

## 2020-09-13 DIAGNOSIS — Z23 Encounter for immunization: Secondary | ICD-10-CM | POA: Diagnosis not present

## 2020-10-31 DIAGNOSIS — Z6833 Body mass index (BMI) 33.0-33.9, adult: Secondary | ICD-10-CM | POA: Diagnosis not present

## 2020-10-31 DIAGNOSIS — E7800 Pure hypercholesterolemia, unspecified: Secondary | ICD-10-CM | POA: Diagnosis not present

## 2020-10-31 DIAGNOSIS — I10 Essential (primary) hypertension: Secondary | ICD-10-CM | POA: Diagnosis not present

## 2020-10-31 DIAGNOSIS — E1169 Type 2 diabetes mellitus with other specified complication: Secondary | ICD-10-CM | POA: Diagnosis not present

## 2020-10-31 DIAGNOSIS — M222X2 Patellofemoral disorders, left knee: Secondary | ICD-10-CM | POA: Diagnosis not present

## 2020-10-31 DIAGNOSIS — E669 Obesity, unspecified: Secondary | ICD-10-CM | POA: Diagnosis not present

## 2020-10-31 NOTE — Progress Notes (Signed)
[facility]  [address] [place],  [place]  [address] [phone]  Clinic Day:  12/7/201  Referring physician: Greig Right, MD   HISTORY OF PRESENT ILLNESS:  The patient is a 74 y.o. female with pathologic stage IIIA (T0 N2a M0) triple negative breast cancer, which included regional lymph node involvement of her right axillary and subpectoral regions.  Of note, a breast MRI never revealed a primary right breast mass.  Furthermore, the patient had CT scans of her chest, abdomen, and pelvis which never showed occult disease metastasis. The patient received 6 cycles of neoadjuvant TAC chemotherapy before undergoing a right mastectomy in November 2016.  The pathology from this surgery still showed no obvious primary breast mass, but 9/27 axillary lymph nodes removed contained cancer.  She then underwent radiation to her right chest wall/axillary region.  Since that time, all follow-up exams and mammograms have not shown any evidence of disease recurrence.  She comes in today for routine followup.  Since her last visit, the patient has been doing very well.  She continues to deny having any new symptoms or findings which concern her for recurrent breast cancer.     PHYSICAL EXAM:  Blood pressure (!) 160/77, pulse 64, temperature 98.2 F (36.8 C), resp. rate 14, height 5\' 4"  (1.626 m), weight 184 lb 9.6 oz (83.7 kg), SpO2 96 %. Wt Readings from Last 3 Encounters:  11/01/20 184 lb 9.6 oz (83.7 kg)  05/04/20 193 lb 9.6 oz (87.8 kg)  08/19/18 186 lb (84.4 kg)   Body mass index is 31.69 kg/m. Performance status (ECOG): 0 - Asymptomatic Physical Exam Constitutional:      Appearance: Normal appearance.  HENT:     Mouth/Throat:     Pharynx: Oropharynx is clear. No oropharyngeal exudate.  Cardiovascular:     Rate and Rhythm: Normal rate and regular rhythm.     Heart sounds: No murmur heard.  No friction rub. No gallop.   Pulmonary:     Breath sounds:  Normal breath sounds.  Chest:     Breasts:        Right: Absent. No swelling, bleeding, inverted nipple, mass, nipple discharge or skin change.        Left: No swelling, bleeding, inverted nipple, mass, nipple discharge or skin change.  Abdominal:     General: Bowel sounds are normal. There is no distension.     Palpations: Abdomen is soft. There is no mass.     Tenderness: There is no abdominal tenderness.  Musculoskeletal:        General: No tenderness.     Cervical back: Normal range of motion and neck supple.     Right lower leg: No edema.     Left lower leg: No edema.  Lymphadenopathy:     Cervical: No cervical adenopathy.     Right cervical: No superficial, deep or posterior cervical adenopathy.    Left cervical: No superficial, deep or posterior cervical adenopathy.     Upper Body:     Right upper body: No supraclavicular or axillary adenopathy.     Left upper body: No supraclavicular or axillary adenopathy.     Lower Body: No right inguinal adenopathy. No left inguinal adenopathy.  Skin:    Coloration: Skin is not jaundiced.     Findings: No lesion or rash.  Neurological:     General: No focal deficit present.     Mental Status: She is alert and oriented to person, place,  and time. Mental status is at baseline.  Psychiatric:        Mood and Affect: Mood normal.        Behavior: Behavior normal.        Thought Content: Thought content normal.        Judgment: Judgment normal.     ASSESSMENT & PLAN:  Assessment/Plan:  A 74 y.o. female with pathologic stage IIIA (T0 N2a M0) triple negative breast cancer, status post neoadjuvant chemotherapy, a right mastectomy, and adjuvant breast radiation.  Based upon her physical exam today, the patient remains disease-free.  Clinically, she appears to be doing very well.  As that is the case, I will begin spacing all future visits out to once per year.  Her annual mammogram will be scheduled before her next visit for her continued  radiographic breast cancer surveillance. The patient understands all the plans discussed today and is in agreement with them.     Sharlet Notaro Macarthur Critchley, MD

## 2020-11-01 ENCOUNTER — Inpatient Hospital Stay: Payer: Medicare HMO | Attending: Oncology | Admitting: Oncology

## 2020-11-01 ENCOUNTER — Telehealth: Payer: Self-pay | Admitting: Oncology

## 2020-11-01 ENCOUNTER — Encounter: Payer: Self-pay | Admitting: Oncology

## 2020-11-01 ENCOUNTER — Other Ambulatory Visit: Payer: Self-pay

## 2020-11-01 VITALS — BP 160/77 | HR 64 | Temp 98.2°F | Resp 14 | Ht 64.0 in | Wt 184.6 lb

## 2020-11-01 DIAGNOSIS — C773 Secondary and unspecified malignant neoplasm of axilla and upper limb lymph nodes: Secondary | ICD-10-CM

## 2020-11-01 DIAGNOSIS — I10 Essential (primary) hypertension: Secondary | ICD-10-CM | POA: Diagnosis not present

## 2020-11-01 DIAGNOSIS — C50911 Malignant neoplasm of unspecified site of right female breast: Secondary | ICD-10-CM | POA: Diagnosis not present

## 2020-11-01 DIAGNOSIS — E730 Congenital lactase deficiency: Secondary | ICD-10-CM | POA: Diagnosis not present

## 2020-11-01 DIAGNOSIS — E1169 Type 2 diabetes mellitus with other specified complication: Secondary | ICD-10-CM | POA: Diagnosis not present

## 2020-11-01 NOTE — Telephone Encounter (Signed)
Per 12/7 LOS, patient scheduled for December 2022 Follow Up Appt.  Gave patient Appt Summary

## 2020-11-04 ENCOUNTER — Other Ambulatory Visit: Payer: Self-pay

## 2020-11-04 DIAGNOSIS — I10 Essential (primary) hypertension: Secondary | ICD-10-CM | POA: Insufficient documentation

## 2020-11-08 ENCOUNTER — Other Ambulatory Visit: Payer: Self-pay

## 2020-11-08 ENCOUNTER — Encounter: Payer: Self-pay | Admitting: Cardiology

## 2020-11-08 ENCOUNTER — Ambulatory Visit: Payer: Medicare HMO | Admitting: Cardiology

## 2020-11-08 VITALS — BP 132/68 | HR 74 | Ht 63.5 in | Wt 184.6 lb

## 2020-11-08 DIAGNOSIS — I2510 Atherosclerotic heart disease of native coronary artery without angina pectoris: Secondary | ICD-10-CM

## 2020-11-08 DIAGNOSIS — I10 Essential (primary) hypertension: Secondary | ICD-10-CM

## 2020-11-08 DIAGNOSIS — E785 Hyperlipidemia, unspecified: Secondary | ICD-10-CM

## 2020-11-08 DIAGNOSIS — E088 Diabetes mellitus due to underlying condition with unspecified complications: Secondary | ICD-10-CM

## 2020-11-08 NOTE — Progress Notes (Signed)
Cardiology Office Note:    Date:  11/08/2020   ID:  Tanya Ayers, DOB 1946-03-03, MRN [id number]  PCP:  Greig Right, MD  Cardiologist:  Jenean Lindau, MD   Referring MD: Greig Right, MD    ASSESSMENT:    1. Coronary artery disease involving native coronary artery of native heart without angina pectoris   2. Essential hypertension   3. Diabetes mellitus due to underlying condition with unspecified complications ([place])   4. Dyslipidemia    PLAN:    In order of problems listed above:  1. Coronary artery disease: Secondary prevention stressed with the patient.  Importance of compliance with with diet medication stressed and she vocalized understanding.  I advised her to walk at least half an hour a day and she continues to do so and we promised to do better with diet and exercise. 2. Essential hypertension: Blood pressure stable and diet was emphasized. 3. Mixed dyslipidemia and diabetes mellitus: I discussed diet at extensive length and she promises to do better.  Her last hemoglobin A1c was elevated and lipids were elevated.  She tells me that she is going to do better with diet and exercise and was little noncompliant and complacent with food habits.  She promises to do better and has primary care lined up for blood work in the next 3 months.  She will send me a copy of this. 4. Patient will be seen in follow-up appointment in 6 months or earlier if the patient has any concerns.  Weight reduction was stressed.   Medication Adjustments/Labs and Tests Ordered: Current medicines are reviewed at length with the patient today.  Concerns regarding medicines are outlined above.  No orders of the defined types were placed in this encounter.  No orders of the defined types were placed in this encounter.    No chief complaint on file.    History of Present Illness:    Tanya Ayers is a 74 y.o. female.  Patient has past medical history of coronary artery disease,  essential hypertension dyslipidemia and history of pulmonary embolism.  She denies any problems at this time and takes care of activities of daily living.  No chest pain orthopnea or PND.  At the time of my evaluation, the patient is alert awake oriented and in no distress.  She has been lax with diet and exercise because of tragedies in her family.  Past Medical History:  Diagnosis Date  . Acquired absence of right breast 11/14/2016  . BMI 32.0-32.9,adult 04/16/2016  . Carcinoma of right breast metastatic to axillary lymph node ([place]) 04/16/2016  . Coronary artery disease involving native coronary artery of native heart without angina pectoris 10/04/2016  . Diabetes mellitus due to underlying condition with unspecified complications (Tanya Ayers) 1/94/1740  . Dyslipidemia 02/09/2016  . Essential hypertension 02/09/2016  . Hypertension     Past Surgical History:  Procedure Laterality Date  . MASTECTOMY      Current Medications: Current Meds  Medication Sig  . ascorbic acid (VITAMIN C) 500 MG tablet Take 500 mg by mouth daily.  Marland Kitchen aspirin 325 MG tablet Take 325 mg by mouth daily.  . Calcium Polycarbophil (FIBER) 625 MG TABS Take 625 mg by mouth daily.  . carvedilol (COREG) 25 MG tablet Take 25 mg by mouth 2 (two) times daily with a meal.  . Cholecalciferol 25 MCG (1000 UT) capsule Take 1,000 Units by mouth daily.  Marland Kitchen [name] 30 MG capsule Take 30 mg by mouth daily.  Marland Kitchen  meloxicam (MOBIC) 15 MG tablet Take 15 mg by mouth as needed.  . metFORMIN (GLUCOPHAGE) 500 MG tablet Take 500 mg by mouth daily.  . Multiple Vitamin (MULTIVITAMIN) capsule Take 1 capsule by mouth daily.   . nitroGLYCERIN (NITROSTAT) 0.4 MG SL tablet Place 0.4 mg under the tongue every 5 (five) minutes as needed.  . Omega-3 Fatty Acids (FISH OIL) 1000 MG CAPS Take 1,000 mg by mouth daily.  Marland Kitchen omeprazole (PRILOSEC) 20 MG capsule Take 20 mg by mouth daily.  . pravastatin (PRAVACHOL) 40 MG tablet Take 40 mg by mouth daily.  .  valsartan-hydrochlorothiazide (DIOVAN-HCT) 320-25 MG tablet Take 1 tablet by mouth daily.     Allergies:   Atorvastatin, Latex, Rosuvastatin, Tape, and Wound dressing adhesive   Social History   Socioeconomic History  . Marital status: Married    Spouse name: Not on file  . Number of children: Not on file  . Years of education: Not on file  . Highest education level: Not on file  Occupational History  . Not on file  Tobacco Use  . Smoking status: Never Smoker  . Smokeless tobacco: Never Used  Vaping Use  . Vaping Use: Never used  Substance and Sexual Activity  . Alcohol use: No  . Drug use: No  . Sexual activity: Not on file  Other Topics Concern  . Not on file  Social History Narrative  . Not on file   Social Determinants of Health   Financial Resource Strain: Not on file  Food Insecurity: Not on file  Transportation Needs: Not on file  Physical Activity: Not on file  Stress: Not on file  Social Connections: Not on file     Family History: The patient's family history includes Clotting disorder in her father; Heart Problems in her father; Heart attack in her mother; Heart disease in her father; Hypertension in her father and mother.  ROS:   Please see the history of present illness.    All other systems reviewed and are negative.  EKGs/Labs/Other Studies Reviewed:    The following studies were reviewed today: I discussed findings with the patient at extensive length.   Recent Labs: No results found for requested labs within last 8760 hours.  Recent Lipid Panel No results found for: CHOL, TRIG, HDL, CHOLHDL, VLDL, LDLCALC, LDLDIRECT  Physical Exam:    VS:  BP 132/68   Pulse 74   Ht 5' 3.5" (1.613 m)   Wt 184 lb 9.6 oz (83.7 kg)   SpO2 97%   BMI 32.19 kg/m     Wt Readings from Last 3 Encounters:  11/08/20 184 lb 9.6 oz (83.7 kg)  11/01/20 184 lb 9.6 oz (83.7 kg)  05/04/20 193 lb 9.6 oz (87.8 kg)     GEN: Patient is in no acute distress HEENT:  Normal NECK: No JVD; No carotid bruits LYMPHATICS: No lymphadenopathy CARDIAC: Hear sounds regular, 2/6 systolic murmur at the apex. RESPIRATORY:  Clear to auscultation without rales, wheezing or rhonchi  ABDOMEN: Soft, non-tender, non-distended MUSCULOSKELETAL:  No edema; No deformity  SKIN: Warm and dry NEUROLOGIC:  Alert and oriented x 3 PSYCHIATRIC:  Normal affect   Signed, Jenean Lindau, MD  11/08/2020 1:47 PM    [facility]

## 2020-11-08 NOTE — Patient Instructions (Signed)

## 2021-03-20 DIAGNOSIS — K219 Gastro-esophageal reflux disease without esophagitis: Secondary | ICD-10-CM | POA: Diagnosis not present

## 2021-03-20 DIAGNOSIS — E1169 Type 2 diabetes mellitus with other specified complication: Secondary | ICD-10-CM | POA: Diagnosis not present

## 2021-03-20 DIAGNOSIS — I10 Essential (primary) hypertension: Secondary | ICD-10-CM | POA: Diagnosis not present

## 2021-03-20 DIAGNOSIS — Z6833 Body mass index (BMI) 33.0-33.9, adult: Secondary | ICD-10-CM | POA: Diagnosis not present

## 2021-03-20 DIAGNOSIS — E7800 Pure hypercholesterolemia, unspecified: Secondary | ICD-10-CM | POA: Diagnosis not present

## 2021-03-20 DIAGNOSIS — E669 Obesity, unspecified: Secondary | ICD-10-CM | POA: Diagnosis not present

## 2021-05-01 DIAGNOSIS — Z1231 Encounter for screening mammogram for malignant neoplasm of breast: Secondary | ICD-10-CM | POA: Diagnosis not present

## 2021-05-09 ENCOUNTER — Ambulatory Visit: Payer: Medicare HMO | Admitting: Cardiology

## 2021-05-09 ENCOUNTER — Encounter: Payer: Self-pay | Admitting: Cardiology

## 2021-05-09 ENCOUNTER — Other Ambulatory Visit: Payer: Self-pay

## 2021-05-09 VITALS — BP 128/68 | HR 78 | Ht 64.0 in | Wt 181.4 lb

## 2021-05-09 DIAGNOSIS — I10 Essential (primary) hypertension: Secondary | ICD-10-CM | POA: Diagnosis not present

## 2021-05-09 DIAGNOSIS — E785 Hyperlipidemia, unspecified: Secondary | ICD-10-CM | POA: Diagnosis not present

## 2021-05-09 DIAGNOSIS — Z9011 Acquired absence of right breast and nipple: Secondary | ICD-10-CM | POA: Diagnosis not present

## 2021-05-09 DIAGNOSIS — Z6831 Body mass index (BMI) 31.0-31.9, adult: Secondary | ICD-10-CM | POA: Diagnosis not present

## 2021-05-09 DIAGNOSIS — I2510 Atherosclerotic heart disease of native coronary artery without angina pectoris: Secondary | ICD-10-CM | POA: Diagnosis not present

## 2021-05-09 DIAGNOSIS — C773 Secondary and unspecified malignant neoplasm of axilla and upper limb lymph nodes: Secondary | ICD-10-CM | POA: Diagnosis not present

## 2021-05-09 DIAGNOSIS — C50911 Malignant neoplasm of unspecified site of right female breast: Secondary | ICD-10-CM | POA: Diagnosis not present

## 2021-05-09 DIAGNOSIS — E088 Diabetes mellitus due to underlying condition with unspecified complications: Secondary | ICD-10-CM | POA: Diagnosis not present

## 2021-05-09 MED ORDER — NITROGLYCERIN 0.4 MG SL SUBL: 0.4000 mg | SUBLINGUAL_TABLET | SUBLINGUAL | 11 refills | Status: DC | PRN

## 2021-05-09 NOTE — Progress Notes (Signed)
Cardiology Office Note:    Date:  05/09/2021   ID:  Tanya Ayers, DOB 1946/11/17, MRN [id number]  PCP:  Greig Right, MD  Cardiologist:  Jenean Lindau, MD   Referring MD: Greig Right, MD    ASSESSMENT:    1. Coronary artery disease involving native coronary artery of native heart without angina pectoris   2. Essential hypertension   3. Diabetes mellitus due to underlying condition with unspecified complications ([place])   4. Dyslipidemia    PLAN:    In order of problems listed above:  Coronary artery disease: Secondary prevention stressed with the patient.  Importance of compliance with diet medication stressed and she vocalized understanding.  She was advised to walk at least 2 miles a day every day and she promises to do so.  At least 5 days a week. Essential hypertension: Blood pressure stable and diet was emphasized. Mixed dyslipidemia: She had complete blood work at her primary care recently and had blood work we will try to get a copy of it.  She was told that it was fine. Diabetes mellitus: Hemoglobin A1c was 6 according to the patient.  I counseled her about exercise weight loss and lifestyle modification and she promises to do better. Patient will be seen in follow-up appointment in 6 months or earlier if the patient has any concerns    Medication Adjustments/Labs and Tests Ordered: Current medicines are reviewed at length with the patient today.  Concerns regarding medicines are outlined above.  No orders of the defined types were placed in this encounter.  No orders of the defined types were placed in this encounter.    No chief complaint on file.    History of Present Illness:    Tanya Ayers is a 75 y.o. female.  Patient has past medical history of coronary artery disease, essential hypertension dyslipidemia and diabetes mellitus.  She is overweight.  She walks on a regular basis.  She denies any chest pain orthopnea or PND.  At the time of my  evaluation, the patient is alert awake oriented and in no distress.  Past Medical History:  Diagnosis Date   Acquired absence of right breast 11/14/2016   BMI 32.0-32.9,adult 04/16/2016   Carcinoma of right breast metastatic to axillary lymph node ([place]) 04/16/2016   Coronary artery disease involving native coronary artery of native heart without angina pectoris 10/04/2016   Diabetes mellitus due to underlying condition with unspecified complications ([place]) 06/07/4579   Dyslipidemia 02/09/2016   Essential hypertension 02/09/2016   Hypertension     Past Surgical History:  Procedure Laterality Date   MASTECTOMY      Current Medications: Current Meds  Medication Sig   ascorbic acid (VITAMIN C) 500 MG tablet Take 500 mg by mouth daily.   aspirin 325 MG tablet Take 325 mg by mouth daily.   Calcium Polycarbophil (FIBER) 625 MG TABS Take 625 mg by mouth daily.   carvedilol (COREG) 25 MG tablet Take 25 mg by mouth 2 (two) times daily with a meal.   Cholecalciferol 25 MCG (1000 UT) capsule Take 1,000 Units by mouth daily.   [name] 30 MG capsule Take 30 mg by mouth daily.   meloxicam (MOBIC) 15 MG tablet Take 15 mg by mouth as needed for pain.   metFORMIN (GLUCOPHAGE) 500 MG tablet Take 500 mg by mouth daily.   Multiple Vitamin (MULTIVITAMIN) capsule Take 1 capsule by mouth daily.    nitroGLYCERIN (NITROSTAT) 0.4 MG SL tablet Place 0.4  mg under the tongue every 5 (five) minutes as needed for chest pain.   Omega-3 Fatty Acids (FISH OIL) 1000 MG CAPS Take 1,000 mg by mouth daily.   omeprazole (PRILOSEC) 20 MG capsule Take 20 mg by mouth daily.   pravastatin (PRAVACHOL) 40 MG tablet Take 40 mg by mouth daily.   valsartan-hydrochlorothiazide (DIOVAN-HCT) 320-25 MG tablet Take 1 tablet by mouth daily.     Allergies:   Atorvastatin, Latex, Rosuvastatin, and Tape   Social History   Socioeconomic History   Marital status: Married    Spouse name: Not on file   Number of children: Not on  file   Years of education: Not on file   Highest education level: Not on file  Occupational History   Not on file  Tobacco Use   Smoking status: Never   Smokeless tobacco: Never  Vaping Use   Vaping Use: Never used  Substance and Sexual Activity   Alcohol use: No   Drug use: No   Sexual activity: Not on file  Other Topics Concern   Not on file  Social History Narrative   Not on file   Social Determinants of Health   Financial Resource Strain: Not on file  Food Insecurity: Not on file  Transportation Needs: Not on file  Physical Activity: Not on file  Stress: Not on file  Social Connections: Not on file     Family History: The patient's family history includes Clotting disorder in her father; Heart Problems in her father; Heart attack in her mother; Heart disease in her father; Hypertension in her father and mother.  ROS:   Please see the history of present illness.    All other systems reviewed and are negative.  EKGs/Labs/Other Studies Reviewed:    The following studies were reviewed today: I discussed my findings with the patient at length.  EKG today was normal.   Recent Labs: No results found for requested labs within last 8760 hours.  Recent Lipid Panel No results found for: CHOL, TRIG, HDL, CHOLHDL, VLDL, LDLCALC, LDLDIRECT  Physical Exam:    VS:  BP 128/68   Pulse 78   Ht 5\' 4"  (1.626 m)   Wt 181 lb 6.4 oz (82.3 kg)   SpO2 98%   BMI 31.14 kg/m     Wt Readings from Last 3 Encounters:  05/09/21 181 lb 6.4 oz (82.3 kg)  11/08/20 184 lb 9.6 oz (83.7 kg)  11/01/20 184 lb 9.6 oz (83.7 kg)     GEN: Patient is in no acute distress HEENT: Normal NECK: No JVD; No carotid bruits LYMPHATICS: No lymphadenopathy CARDIAC: Hear sounds regular, 2/6 systolic murmur at the apex. RESPIRATORY:  Clear to auscultation without rales, wheezing or rhonchi  ABDOMEN: Soft, non-tender, non-distended MUSCULOSKELETAL:  No edema; No deformity  SKIN: Warm and  dry NEUROLOGIC:  Alert and oriented x 3 PSYCHIATRIC:  Normal affect   Signed, Jenean Lindau, MD  05/09/2021 8:13 AM    [place]

## 2021-05-09 NOTE — Patient Instructions (Signed)

## 2021-05-25 DIAGNOSIS — I10 Essential (primary) hypertension: Secondary | ICD-10-CM | POA: Diagnosis not present

## 2021-05-25 DIAGNOSIS — E7800 Pure hypercholesterolemia, unspecified: Secondary | ICD-10-CM | POA: Diagnosis not present

## 2021-05-25 DIAGNOSIS — E119 Type 2 diabetes mellitus without complications: Secondary | ICD-10-CM | POA: Diagnosis not present

## 2021-06-25 DIAGNOSIS — I10 Essential (primary) hypertension: Secondary | ICD-10-CM | POA: Diagnosis not present

## 2021-06-25 DIAGNOSIS — E1169 Type 2 diabetes mellitus with other specified complication: Secondary | ICD-10-CM | POA: Diagnosis not present

## 2021-06-25 DIAGNOSIS — E7800 Pure hypercholesterolemia, unspecified: Secondary | ICD-10-CM | POA: Diagnosis not present

## 2021-06-30 DIAGNOSIS — I2510 Atherosclerotic heart disease of native coronary artery without angina pectoris: Secondary | ICD-10-CM | POA: Diagnosis not present

## 2021-06-30 DIAGNOSIS — M859 Disorder of bone density and structure, unspecified: Secondary | ICD-10-CM | POA: Diagnosis not present

## 2021-06-30 DIAGNOSIS — I10 Essential (primary) hypertension: Secondary | ICD-10-CM | POA: Diagnosis not present

## 2021-06-30 DIAGNOSIS — Z853 Personal history of malignant neoplasm of breast: Secondary | ICD-10-CM | POA: Diagnosis not present

## 2021-06-30 DIAGNOSIS — E1169 Type 2 diabetes mellitus with other specified complication: Secondary | ICD-10-CM | POA: Diagnosis not present

## 2021-06-30 DIAGNOSIS — Z0000 Encounter for general adult medical examination without abnormal findings: Secondary | ICD-10-CM | POA: Diagnosis not present

## 2021-06-30 DIAGNOSIS — E7800 Pure hypercholesterolemia, unspecified: Secondary | ICD-10-CM | POA: Diagnosis not present

## 2021-06-30 DIAGNOSIS — E669 Obesity, unspecified: Secondary | ICD-10-CM | POA: Diagnosis not present

## 2021-06-30 DIAGNOSIS — K219 Gastro-esophageal reflux disease without esophagitis: Secondary | ICD-10-CM | POA: Diagnosis not present

## 2021-06-30 DIAGNOSIS — Z6833 Body mass index (BMI) 33.0-33.9, adult: Secondary | ICD-10-CM | POA: Diagnosis not present

## 2021-07-01 DIAGNOSIS — E119 Type 2 diabetes mellitus without complications: Secondary | ICD-10-CM | POA: Diagnosis not present

## 2021-07-01 DIAGNOSIS — X58XXXA Exposure to other specified factors, initial encounter: Secondary | ICD-10-CM | POA: Diagnosis not present

## 2021-07-01 DIAGNOSIS — S61216A Laceration without foreign body of right little finger without damage to nail, initial encounter: Secondary | ICD-10-CM | POA: Diagnosis not present

## 2021-07-01 DIAGNOSIS — Z23 Encounter for immunization: Secondary | ICD-10-CM | POA: Diagnosis not present

## 2021-07-03 DIAGNOSIS — E559 Vitamin D deficiency, unspecified: Secondary | ICD-10-CM | POA: Diagnosis not present

## 2021-07-03 DIAGNOSIS — E1169 Type 2 diabetes mellitus with other specified complication: Secondary | ICD-10-CM | POA: Diagnosis not present

## 2021-07-03 DIAGNOSIS — E7800 Pure hypercholesterolemia, unspecified: Secondary | ICD-10-CM | POA: Diagnosis not present

## 2021-07-03 DIAGNOSIS — I10 Essential (primary) hypertension: Secondary | ICD-10-CM | POA: Diagnosis not present

## 2021-07-03 LAB — COMPREHENSIVE METABOLIC PANEL
Albumin: 4.4 (ref 3.5–5.0)
Calcium: 10.2 (ref 8.7–10.7)
GFR calc Af Amer: 85
GFR calc non Af Amer: 73

## 2021-07-03 LAB — LIPID PANEL
Cholesterol: 211 — AB (ref 0–200)
HDL: 87 — AB (ref 35–70)
LDL Cholesterol: 105
LDl/HDL Ratio: 1.2
Triglycerides: 95 (ref 40–160)

## 2021-07-03 LAB — BASIC METABOLIC PANEL
BUN: 15 (ref 4–21)
CO2: 34 — AB (ref 13–22)
Chloride: 96 — AB (ref 99–108)
Creatinine: 0.8 (ref 0.5–1.1)
Glucose: 100
Potassium: 3.8 (ref 3.4–5.3)
Sodium: 138 (ref 137–147)

## 2021-07-03 LAB — VITAMIN D 25 HYDROXY (VIT D DEFICIENCY, FRACTURES): Vit D, 25-Hydroxy: 82

## 2021-07-03 LAB — HEPATIC FUNCTION PANEL
ALT: 25 (ref 7–35)
AST: 29 (ref 13–35)
Alkaline Phosphatase: 91 (ref 25–125)
Bilirubin, Total: 0.4

## 2021-07-03 LAB — HEMOGLOBIN A1C: Hemoglobin A1C: 6.4

## 2021-07-19 DIAGNOSIS — E119 Type 2 diabetes mellitus without complications: Secondary | ICD-10-CM | POA: Diagnosis not present

## 2021-07-21 ENCOUNTER — Encounter: Payer: Self-pay | Admitting: Hematology and Oncology

## 2021-08-25 DIAGNOSIS — I10 Essential (primary) hypertension: Secondary | ICD-10-CM | POA: Diagnosis not present

## 2021-08-25 DIAGNOSIS — E7800 Pure hypercholesterolemia, unspecified: Secondary | ICD-10-CM | POA: Diagnosis not present

## 2021-08-25 DIAGNOSIS — E1169 Type 2 diabetes mellitus with other specified complication: Secondary | ICD-10-CM | POA: Diagnosis not present

## 2021-10-25 NOTE — Progress Notes (Signed)
[facility]  [address] [place],  [place]  [address] [phone]  Clinic Day:  11/01/2021  Referring physician: Greig Right, MD  This document serves as a record of services personally performed by Shaylynne Lunt Macarthur Critchley, MD. It was created on their behalf by [facility] E, a trained medical scribe. The creation of this record is based on the scribe's personal observations and the provider's statements to them.  HISTORY OF PRESENT ILLNESS:  The patient is a Tanya Ayers with pathologic stage IIIA (T0 N2a M0) triple negative breast cancer, which included regional lymph node involvement of her right axillary and subpectoral regions.  Of note, a breast MRI never revealed a primary right breast mass.  Furthermore, the patient had CT scans of her chest, abdomen, and pelvis which never showed occult disease metastasis. The patient received 6 cycles of neoadjuvant TAC chemotherapy before undergoing a right mastectomy in November 2016.  The pathology from this surgery still showed no obvious primary breast mass, but 9/27 axillary lymph nodes removed contained cancer.  She then underwent radiation to her right chest wall/axillary region.  Since that time, all follow-up exams and mammograms have never shown any evidence of disease recurrence.  She comes in today for routine followup.  Since her last visit, the patient has been doing very well.  She continues to deny having any new symptoms or findings which concern her for recurrent breast cancer.  Of note, her annual mammogram in June 2022 showed no evidence of disease recurrence.  PHYSICAL EXAM:  Blood pressure (!) 161/74, pulse 75, temperature 99.1 F (37.3 C), resp. rate 16, height 5\' 4"  (1.626 m), weight 184 lb 14.4 oz (83.9 kg), SpO2 97 %. Wt Readings from Last 3 Encounters:  11/01/21 184 lb 14.4 oz (83.9 kg)  05/09/21 181 lb 6.4 oz (82.3 kg)  11/08/20 184 lb 9.6 oz (83.7 kg)   Body mass index is 31.74  kg/m. Performance status (ECOG): 0 - Asymptomatic Physical Exam Constitutional:      Appearance: Normal appearance.  HENT:     Mouth/Throat:     Pharynx: Oropharynx is clear. No oropharyngeal exudate.  Cardiovascular:     Rate and Rhythm: Normal rate and regular rhythm.     Heart sounds: No murmur heard.   No friction rub. No gallop.  Pulmonary:     Breath sounds: Normal breath sounds.  Chest:  Breasts:    Right: Absent. No swelling, bleeding, inverted nipple, mass, nipple discharge or skin change.     Left: No swelling, bleeding, inverted nipple, mass, nipple discharge or skin change.  Abdominal:     General: Bowel sounds are normal. There is no distension.     Palpations: Abdomen is soft. There is no mass.     Tenderness: There is no abdominal tenderness.  Musculoskeletal:        General: No tenderness.     Cervical back: Normal range of motion and neck supple.     Right lower leg: No edema.     Left lower leg: No edema.  Lymphadenopathy:     Cervical: No cervical adenopathy.     Right cervical: No superficial, deep or posterior cervical adenopathy.    Left cervical: No superficial, deep or posterior cervical adenopathy.     Upper Body:     Right upper body: No supraclavicular or axillary adenopathy.     Left upper body: No supraclavicular or axillary adenopathy.     Lower Body: No right  inguinal adenopathy. No left inguinal adenopathy.  Skin:    Coloration: Skin is not jaundiced.     Findings: No lesion or rash.  Neurological:     General: No focal deficit present.     Mental Status: She is alert and oriented to person, place, and time. Mental status is at baseline.  Psychiatric:        Mood and Affect: Mood normal.        Behavior: Behavior normal.        Thought Content: Thought content normal.        Judgment: Judgment normal.   MAMMOGRAPHY RESULTS:  EXAM: 05/01/2021 DIGITAL SCREENING UNILATERAL LEFT MAMMOGRAM WITH CAD AND TOMOSYNTHESIS  ACR Breast Density  Category b: There are scattered areas of fibroglandular density.  FINDINGS: There are no findings suspicious for malignancy. The images were evaluated with computer-aided detection.  IMPRESSION: No mammographic evidence of malignancy.  ASSESSMENT & PLAN:  Assessment/Plan:  A Tanya Ayers with pathologic stage IIIA (T0 N2a M0) triple negative breast cancer, status post neoadjuvant chemotherapy, a right mastectomy, and adjuvant breast radiation.  Based upon her clinical breast exam today, the patient remains disease-free.  Clinically, she appears to be doing very well.  As that is the case, I will see her back in 1 year.  Her annual mammogram will be scheduled before her next visit for her continued radiographic breast cancer surveillance. The patient understands all the plans discussed today and is in agreement with them.    I, Rita Ohara, am acting as scribe for Marice Potter, MD    I have reviewed this report as typed by the medical scribe, and it is complete and accurate.  Yarexi Pawlicki Macarthur Critchley, MD

## 2021-11-01 ENCOUNTER — Other Ambulatory Visit: Payer: Self-pay | Admitting: Oncology

## 2021-11-01 ENCOUNTER — Telehealth: Payer: Self-pay | Admitting: Oncology

## 2021-11-01 ENCOUNTER — Inpatient Hospital Stay: Payer: Medicare HMO | Attending: Oncology | Admitting: Oncology

## 2021-11-01 VITALS — BP 161/74 | HR 75 | Temp 99.1°F | Resp 16 | Ht 64.0 in | Wt 184.9 lb

## 2021-11-01 DIAGNOSIS — C773 Secondary and unspecified malignant neoplasm of axilla and upper limb lymph nodes: Secondary | ICD-10-CM

## 2021-11-01 DIAGNOSIS — C50911 Malignant neoplasm of unspecified site of right female breast: Secondary | ICD-10-CM

## 2021-11-01 NOTE — Telephone Encounter (Signed)
Per 12/7 los next appt scheduled and confirmed with patient

## 2021-11-10 DIAGNOSIS — Z23 Encounter for immunization: Secondary | ICD-10-CM | POA: Diagnosis not present

## 2021-12-14 ENCOUNTER — Ambulatory Visit: Payer: Medicare HMO | Admitting: Cardiology

## 2021-12-14 ENCOUNTER — Other Ambulatory Visit: Payer: Self-pay

## 2021-12-14 ENCOUNTER — Encounter: Payer: Self-pay | Admitting: Cardiology

## 2021-12-14 VITALS — BP 144/86 | HR 58 | Ht 63.6 in | Wt 181.8 lb

## 2021-12-14 DIAGNOSIS — E088 Diabetes mellitus due to underlying condition with unspecified complications: Secondary | ICD-10-CM | POA: Diagnosis not present

## 2021-12-14 DIAGNOSIS — I2510 Atherosclerotic heart disease of native coronary artery without angina pectoris: Secondary | ICD-10-CM | POA: Diagnosis not present

## 2021-12-14 DIAGNOSIS — C773 Secondary and unspecified malignant neoplasm of axilla and upper limb lymph nodes: Secondary | ICD-10-CM

## 2021-12-14 DIAGNOSIS — E785 Hyperlipidemia, unspecified: Secondary | ICD-10-CM | POA: Diagnosis not present

## 2021-12-14 DIAGNOSIS — C50911 Malignant neoplasm of unspecified site of right female breast: Secondary | ICD-10-CM | POA: Diagnosis not present

## 2021-12-14 DIAGNOSIS — I10 Essential (primary) hypertension: Secondary | ICD-10-CM

## 2021-12-14 DIAGNOSIS — Z6832 Body mass index (BMI) 32.0-32.9, adult: Secondary | ICD-10-CM

## 2021-12-14 NOTE — Progress Notes (Signed)
Cardiology Office Note:    Date:  12/14/2021   ID:  [name], DOB Jan 25, 1946, MRN [id number]  PCP:  Tanya Right, MD  Cardiologist:  Jenean Lindau, MD   Referring MD: Tanya Right, MD    ASSESSMENT:    1. Coronary artery disease involving native coronary artery of native heart without angina pectoris   2. Essential hypertension   3. BMI 32.0-32.9,adult   4. Carcinoma of Ayers breast metastatic to axillary lymph node ([place])   5. Diabetes mellitus due to underlying condition with unspecified complications ([place])   6. Dyslipidemia    PLAN:    In order of problems listed above:  Coronary artery disease: Secondary prevention stressed with the patient.  Importance of compliance with diet medication stressed and she vocalized understanding.  She walks on a regular basis more than 30 minutes a day. Essential hypertension: Blood pressure stable and diet was emphasized.  Her home blood pressures are fine.  Lifestyle modification stressed Diabetes mellitus and mixed dyslipidemia: Importance of exercise stressed.  She is doing well with exercise.  Diet was emphasized.  I will check her lipids today as they were elevated last time.  If you are not at goal I will change her to medication such as rosuvastatin 20 mg.  She is agreeable. Obesity: Weight reduction stressed risks of obesity explained and she promises to do better. Patient will be seen in follow-up appointment in 6 months or earlier if the patient has any concerns    Medication Adjustments/Labs and Tests Ordered: Current medicines are reviewed at length with the patient today.  Concerns regarding medicines are outlined above.  No orders of the defined types were placed in this encounter.  No orders of the defined types were placed in this encounter.    No chief complaint on file.    History of Present Illness:    Tanya Ayers is a 76 y.o. female.  Patient has past medical history of coronary artery disease,  essential hypertension, dyslipidemia, obesity and history of pulmonary embolism.  She denies any problems at this time and takes care of activities of daily living.  No chest pain orthopnea or PND.  At the time of my evaluation, the patient is alert awake oriented and in no distress.  Past Medical History:  Diagnosis Date   Acquired absence of Ayers breast 11/14/2016   BMI 32.0-32.9,adult 04/16/2016   Carcinoma of Ayers breast metastatic to axillary lymph node ([place]) 04/16/2016   Coronary artery disease involving native coronary artery of native heart without angina pectoris 10/04/2016   Diabetes mellitus due to underlying condition with unspecified complications ([place]) 8/36/6294   Dyslipidemia 02/09/2016   Essential hypertension 02/09/2016   Hypertension     Past Surgical History:  Procedure Laterality Date   MASTECTOMY      Current Medications: Current Meds  Medication Sig   ascorbic acid (VITAMIN C) 500 MG tablet Take 500 mg by mouth daily.   aspirin 325 MG tablet Take 325 mg by mouth daily.   Calcium Polycarbophil (FIBER) 625 MG TABS Take 625 mg by mouth daily.   carvedilol (COREG) 25 MG tablet Take 25 mg by mouth 2 (two) times daily with a meal.   Cholecalciferol 25 MCG (1000 UT) capsule Take 1,000 Units by mouth daily.   co-enzyme Q-10 30 MG capsule Take 30 mg by mouth daily.   Glucosamine-Chondroitin (GLUCOSAMINE CHONDR COMPLEX PO) Take 1 tablet by mouth 2 (two) times daily.   meloxicam (MOBIC) 15 MG  tablet Take 15 mg by mouth as needed for pain.   metFORMIN (GLUCOPHAGE) 500 MG tablet Take 500 mg by mouth daily.   Multiple Vitamin (MULTIVITAMIN) capsule Take 1 capsule by mouth daily.    nitroGLYCERIN (NITROSTAT) 0.4 MG SL tablet Place 1 tablet (0.4 mg total) under the tongue every 5 (five) minutes as needed for chest pain.   Omega-3 Fatty Acids (FISH OIL) 1000 MG CAPS Take 1,000 mg by mouth daily.   omeprazole (PRILOSEC) 20 MG capsule Take 20 mg by mouth daily.   pravastatin  (PRAVACHOL) 40 MG tablet Take 40 mg by mouth daily.   valsartan-hydrochlorothiazide (DIOVAN-HCT) 320-25 MG tablet Take 1 tablet by mouth daily.   vitamin E 180 MG (400 UNITS) capsule Take 400 Units by mouth daily.     Allergies:   Atorvastatin, Latex, Rosuvastatin, and Tape   Social History   Socioeconomic History   Marital status: Married    Spouse name: Not on file   Number of children: Not on file   Years of education: Not on file   Highest education level: Not on file  Occupational History   Not on file  Tobacco Use   Smoking status: Never   Smokeless tobacco: Never  Vaping Use   Vaping Use: Never used  Substance and Sexual Activity   Alcohol use: No   Drug use: No   Sexual activity: Not on file  Other Topics Concern   Not on file  Social History Narrative   Not on file   Social Determinants of Health   Financial Resource Strain: Not on file  Food Insecurity: Not on file  Transportation Needs: Not on file  Physical Activity: Not on file  Stress: Not on file  Social Connections: Not on file     Family History: The patient's family history includes Clotting disorder in her father; Heart Problems in her father; Heart attack in her mother; Heart disease in her father; Hypertension in her father and mother.  ROS:   Please see the history of present illness.    All other systems reviewed and are negative.  EKGs/Labs/Other Studies Reviewed:    The following studies were reviewed today: I discussed my findings with the patient at length.   Recent Labs: 07/03/2021: ALT 25; BUN 15; Creatinine 0.8; Potassium 3.8; Sodium 138  Recent Lipid Panel    Component Value Date/Time   CHOL 211 (A) 07/03/2021 0000   TRIG 95 07/03/2021 0000   HDL 87 (A) 07/03/2021 0000   LDLCALC 105 07/03/2021 0000    Physical Exam:    VS:  BP (!) 144/86    Pulse (!) 58    Ht 5' 3.6" (1.615 m)    Wt 181 lb 12.8 oz (82.5 kg)    SpO2 97%    BMI 31.60 kg/m     Wt Readings from Last 3  Encounters:  12/14/21 181 lb 12.8 oz (82.5 kg)  11/01/21 184 lb 14.4 oz (83.9 kg)  05/09/21 181 lb 6.4 oz (82.3 kg)     GEN: Patient is in no acute distress HEENT: Normal NECK: No JVD; No carotid bruits LYMPHATICS: No lymphadenopathy CARDIAC: Hear sounds regular, 2/6 systolic murmur at the apex. RESPIRATORY:  Clear to auscultation without rales, wheezing or rhonchi  ABDOMEN: Soft, non-tender, non-distended MUSCULOSKELETAL:  No edema; No deformity  SKIN: Warm and dry NEUROLOGIC:  Alert and oriented x 3 PSYCHIATRIC:  Normal affect   Signed, Jenean Lindau, MD  12/14/2021 9:52 AM  [facility]

## 2021-12-14 NOTE — Addendum Note (Signed)
Addended by: Truddie Hidden on: 12/14/2021 10:08 AM   Modules accepted: Orders

## 2021-12-14 NOTE — Patient Instructions (Signed)
Medication Instructions:  Your physician recommends that you continue on your current medications as directed. Please refer to the Current Medication list given to you today.  *If you need a refill on your cardiac medications before your next appointment, please call your pharmacy*   Lab Work: Your physician recommends that you have labs done in the office today. Your test included  basic metabolic panel,  liver function and lipids.  If you have labs (blood work) drawn today and your tests are completely normal, you will receive your results only by: [place] (if you have MyChart) OR A paper copy in the mail If you have any lab test that is abnormal or we need to change your treatment, we will call you to review the results.   Testing/Procedures: None ordered   Follow-Up: At [facility], you and your health needs are our priority.  As part of our continuing mission to provide you with exceptional heart care, we have created designated Provider Care Teams.  These Care Teams include your primary Cardiologist (physician) and Advanced Practice Providers (APPs -  Physician Assistants and Nurse Practitioners) who all work together to provide you with the care you need, when you need it.  We recommend signing up for the patient portal called "MyChart".  Sign up information is provided on this After Visit Summary.  MyChart is used to connect with patients for Virtual Visits (Telemedicine).  Patients are able to view lab/test results, encounter notes, upcoming appointments, etc.  Non-urgent messages can be sent to your provider as well.   To learn more about what you can do with MyChart, go to [email or web address].    Your next appointment:   6 month(s)  The format for your next appointment:   In Person  Provider:   Jyl Heinz, MD   Other Instructions NA

## 2021-12-15 ENCOUNTER — Telehealth: Payer: Self-pay

## 2021-12-15 DIAGNOSIS — E785 Hyperlipidemia, unspecified: Secondary | ICD-10-CM

## 2021-12-15 LAB — LIPID PANEL
Chol/HDL Ratio: 2.4 ratio (ref 0.0–4.4)
Cholesterol, Total: 207 mg/dL — ABNORMAL HIGH (ref 100–199)
HDL: 86 mg/dL (ref >39)
LDL Chol Calc (NIH): 108 mg/dL — ABNORMAL HIGH (ref 0–99)
Triglycerides: 75 mg/dL (ref 0–149)
VLDL Cholesterol Cal: 13 mg/dL (ref 5–40)

## 2021-12-15 LAB — BASIC METABOLIC PANEL
BUN/Creatinine Ratio: 22 (ref 12–28)
BUN: 18 mg/dL (ref 8–27)
CO2: 28 mmol/L (ref 20–29)
Calcium: 10 mg/dL (ref 8.7–10.3)
Chloride: 99 mmol/L (ref 96–106)
Creatinine, Ser: 0.83 mg/dL (ref 0.57–1.00)
Glucose: 92 mg/dL (ref 70–99)
Potassium: 4.4 mmol/L (ref 3.5–5.2)
Sodium: 140 mmol/L (ref 134–144)
eGFR: 74 mL/min/{1.73_m2} (ref >59)

## 2021-12-15 LAB — HEPATIC FUNCTION PANEL
ALT: 24 IU/L (ref 0–32)
AST: 28 IU/L (ref 0–40)
Albumin: 4.3 g/dL (ref 3.7–4.7)
Alkaline Phosphatase: 80 IU/L (ref 44–121)
Bilirubin Total: 0.3 mg/dL (ref 0.0–1.2)
Bilirubin, Direct: <0.1 mg/dL (ref 0.00–0.40)
Total Protein: 6.9 g/dL (ref 6.0–8.5)

## 2021-12-15 MED ORDER — ROSUVASTATIN CALCIUM 20 MG PO TABS: 20.0000 mg | ORAL_TABLET | Freq: Every day | ORAL | 3 refills | Status: DC

## 2021-12-15 MED ORDER — ASPIRIN EC 81 MG PO TBEC: 81.0000 mg | DELAYED_RELEASE_TABLET | Freq: Every day | ORAL | 3 refills | Status: DC

## 2021-12-15 NOTE — Telephone Encounter (Signed)
-----   Message from Jenean Lindau, MD sent at 12/15/2021  8:14 AM EST ----- Please make sure she is taking aspirin only 81 mg daily coated.  Change pravastatin to rosuvastatin 20 mg daily.  Liver lipid check in 6 weeks.  Copy primary care Jenean Lindau, MD 12/15/2021 8:14 AM

## 2021-12-15 NOTE — Telephone Encounter (Signed)
Left message on patients voicemail to please return our call.   

## 2021-12-15 NOTE — Telephone Encounter (Signed)
Spoke with patient regarding results and recommendation.  Patient verbalizes understanding and is agreeable to plan of care. Advised patient to call back with any issues or concerns.  

## 2022-01-24 ENCOUNTER — Telehealth: Payer: Self-pay | Admitting: Cardiology

## 2022-01-24 NOTE — Telephone Encounter (Signed)
Sent via EPIC 

## 2022-01-24 NOTE — Telephone Encounter (Signed)
Adonis Brook with [facility] is requesting a copy of 1/19 lab results. ? ?Phone#: [phone] ?Fax#: [phone] ?

## 2022-01-26 ENCOUNTER — Other Ambulatory Visit: Payer: Self-pay

## 2022-01-26 DIAGNOSIS — K219 Gastro-esophageal reflux disease without esophagitis: Secondary | ICD-10-CM | POA: Diagnosis not present

## 2022-01-26 DIAGNOSIS — E7800 Pure hypercholesterolemia, unspecified: Secondary | ICD-10-CM | POA: Diagnosis not present

## 2022-01-26 DIAGNOSIS — E785 Hyperlipidemia, unspecified: Secondary | ICD-10-CM

## 2022-01-26 DIAGNOSIS — C50911 Malignant neoplasm of unspecified site of right female breast: Secondary | ICD-10-CM

## 2022-01-26 DIAGNOSIS — E669 Obesity, unspecified: Secondary | ICD-10-CM | POA: Diagnosis not present

## 2022-01-26 DIAGNOSIS — Z6832 Body mass index (BMI) 32.0-32.9, adult: Secondary | ICD-10-CM | POA: Diagnosis not present

## 2022-01-26 DIAGNOSIS — E1169 Type 2 diabetes mellitus with other specified complication: Secondary | ICD-10-CM | POA: Diagnosis not present

## 2022-01-26 DIAGNOSIS — I10 Essential (primary) hypertension: Secondary | ICD-10-CM | POA: Diagnosis not present

## 2022-01-26 LAB — LIPID PANEL
Chol/HDL Ratio: 2.4 ratio (ref 0.0–4.4)
Cholesterol, Total: 182 mg/dL (ref 100–199)
HDL: 76 mg/dL (ref >39)
LDL Chol Calc (NIH): 92 mg/dL (ref 0–99)
Triglycerides: 79 mg/dL (ref 0–149)
VLDL Cholesterol Cal: 14 mg/dL (ref 5–40)

## 2022-01-26 LAB — HEPATIC FUNCTION PANEL
ALT: 21 IU/L (ref 0–32)
AST: 30 IU/L (ref 0–40)
Albumin: 4.4 g/dL (ref 3.7–4.7)
Alkaline Phosphatase: 98 IU/L (ref 44–121)
Bilirubin Total: 0.4 mg/dL (ref 0.0–1.2)
Bilirubin, Direct: 0.12 mg/dL (ref 0.00–0.40)
Total Protein: 7.4 g/dL (ref 6.0–8.5)

## 2022-05-04 DIAGNOSIS — Z1231 Encounter for screening mammogram for malignant neoplasm of breast: Secondary | ICD-10-CM | POA: Diagnosis not present

## 2022-05-07 ENCOUNTER — Encounter: Payer: Self-pay | Admitting: Oncology

## 2022-06-07 DIAGNOSIS — I10 Essential (primary) hypertension: Secondary | ICD-10-CM | POA: Diagnosis not present

## 2022-06-07 DIAGNOSIS — E669 Obesity, unspecified: Secondary | ICD-10-CM | POA: Diagnosis not present

## 2022-06-07 DIAGNOSIS — R5383 Other fatigue: Secondary | ICD-10-CM | POA: Diagnosis not present

## 2022-06-07 DIAGNOSIS — K219 Gastro-esophageal reflux disease without esophagitis: Secondary | ICD-10-CM | POA: Diagnosis not present

## 2022-06-07 DIAGNOSIS — E1169 Type 2 diabetes mellitus with other specified complication: Secondary | ICD-10-CM | POA: Diagnosis not present

## 2022-06-07 DIAGNOSIS — M791 Myalgia, unspecified site: Secondary | ICD-10-CM | POA: Diagnosis not present

## 2022-06-07 DIAGNOSIS — Z6832 Body mass index (BMI) 32.0-32.9, adult: Secondary | ICD-10-CM | POA: Diagnosis not present

## 2022-06-07 DIAGNOSIS — E7800 Pure hypercholesterolemia, unspecified: Secondary | ICD-10-CM | POA: Diagnosis not present

## 2022-06-13 ENCOUNTER — Other Ambulatory Visit: Payer: Self-pay

## 2022-06-14 ENCOUNTER — Ambulatory Visit: Payer: Medicare HMO | Admitting: Cardiology

## 2022-06-14 ENCOUNTER — Encounter: Payer: Self-pay | Admitting: Cardiology

## 2022-06-14 VITALS — BP 100/60 | HR 77 | Ht 63.6 in | Wt 180.8 lb

## 2022-06-14 DIAGNOSIS — I10 Essential (primary) hypertension: Secondary | ICD-10-CM

## 2022-06-14 DIAGNOSIS — E088 Diabetes mellitus due to underlying condition with unspecified complications: Secondary | ICD-10-CM | POA: Diagnosis not present

## 2022-06-14 DIAGNOSIS — E785 Hyperlipidemia, unspecified: Secondary | ICD-10-CM | POA: Diagnosis not present

## 2022-06-14 DIAGNOSIS — I2510 Atherosclerotic heart disease of native coronary artery without angina pectoris: Secondary | ICD-10-CM

## 2022-06-14 MED ORDER — VALSARTAN-HYDROCHLOROTHIAZIDE 160-12.5 MG PO TABS: 1.0000 | ORAL_TABLET | Freq: Every day | ORAL | 3 refills | Status: DC

## 2022-06-14 NOTE — Progress Notes (Signed)
Cardiology Office Note:    Date:  06/14/2022   ID:  Tanya Ayers, DOB 1946/05/01, MRN [id number]  PCP:  Greig Right, MD  Cardiologist:  Jenean Lindau, MD   Referring MD: Greig Right, MD    ASSESSMENT:    1. Coronary artery disease involving native coronary artery of native heart without angina pectoris   2. Essential hypertension   3. Diabetes mellitus due to underlying condition with unspecified complications ([place])   4. Dyslipidemia    PLAN:    In order of problems listed above:  Coronary artery disease: Secondary prevention stressed with the patient.  Importance of compliance with diet medication stressed and she vocalized understanding. Essential hypertension: She feels fatigued with activity so we will try to cut down her ACE inhibitor diuretic combination to half dose daily.  Her blood pressures even at home are borderline hopefully this intervention will make her blood pressure better. Mixed dyslipidemia: Intolerant to statin therapy which is rosuvastatin.  We will put her back on pravastatin and she is tolerating it well.  She will be back in 6 weeks for liver lipid check.  Her lipids are markedly elevated.  Diet was discussed. Obesity: Weight reduction stressed and diet was emphasized.  Also diabetes issues were discussed and these are managed by primary care. Patient will be seen in follow-up appointment in 6 weeks or earlier if the patient has any concerns    Medication Adjustments/Labs and Tests Ordered: Current medicines are reviewed at length with the patient today.  Concerns regarding medicines are outlined above.  No orders of the defined types were placed in this encounter.  No orders of the defined types were placed in this encounter.    No chief complaint on file.    History of Present Illness:    Tanya Ayers is a 76 y.o. female.  Patient has past medical history of coronary artery disease, essential hypertension, mixed dyslipidemia and  diabetes mellitus.  She denies any problems at this time and takes care of activities of daily living.  No chest pain orthopnea or PND.  At the time of my evaluation, the patient is alert awake oriented and in no distress.  She tells me that she could not tolerate rosuvastatin and switch to pravastatin.  Past Medical History:  Diagnosis Date   Acquired absence of right breast 11/14/2016   BMI 32.0-32.9,adult 04/16/2016   Carcinoma of right breast metastatic to axillary lymph node ([place]) 04/16/2016   Coronary artery disease involving native coronary artery of native heart without angina pectoris 10/04/2016   Diabetes mellitus due to underlying condition with unspecified complications ([place]) 2/69/4854   Dyslipidemia 02/09/2016   Essential hypertension 02/09/2016   Hypertension     Past Surgical History:  Procedure Laterality Date   MASTECTOMY      Current Medications: Current Meds  Medication Sig   ascorbic acid (VITAMIN C) 500 MG tablet Take 500 mg by mouth daily.   aspirin EC 81 MG tablet Take 1 tablet (81 mg total) by mouth daily. Swallow whole.   Calcium Polycarbophil (FIBER) 625 MG TABS Take 625 mg by mouth daily.   carvedilol (COREG) 25 MG tablet Take 25 mg by mouth 2 (two) times daily with a meal.   Cholecalciferol 25 MCG (1000 UT) capsule Take 1,000 Units by mouth daily.   [name] 30 MG capsule Take 30 mg by mouth daily.   Glucosamine-Chondroitin (GLUCOSAMINE CHONDR COMPLEX PO) Take 1 tablet by mouth 2 (two) times daily.  meloxicam (MOBIC) 15 MG tablet Take 15 mg by mouth as needed for pain.   metFORMIN (GLUCOPHAGE) 500 MG tablet Take 500 mg by mouth daily.   Multiple Vitamin (MULTIVITAMIN) capsule Take 1 capsule by mouth daily.    nitroGLYCERIN (NITROSTAT) 0.4 MG SL tablet Place 1 tablet (0.4 mg total) under the tongue every 5 (five) minutes as needed for chest pain.   Omega-3 Fatty Acids (FISH OIL) 1000 MG CAPS Take 1,000 mg by mouth daily.   omeprazole (PRILOSEC) 20 MG  capsule Take 20 mg by mouth daily.   pravastatin (PRAVACHOL) 20 MG tablet Take 20 mg by mouth at bedtime.   valsartan-hydrochlorothiazide (DIOVAN-HCT) 320-25 MG tablet Take 1 tablet by mouth daily.   vitamin E 180 MG (400 UNITS) capsule Take 400 Units by mouth daily.     Allergies:   Atorvastatin, Latex, Rosuvastatin, and Tape   Social History   Socioeconomic History   Marital status: Married    Spouse name: Not on file   Number of children: Not on file   Years of education: Not on file   Highest education level: Not on file  Occupational History   Not on file  Tobacco Use   Smoking status: Never   Smokeless tobacco: Never  Vaping Use   Vaping Use: Never used  Substance and Sexual Activity   Alcohol use: No   Drug use: No   Sexual activity: Not on file  Other Topics Concern   Not on file  Social History Narrative   Not on file   Social Determinants of Health   Financial Resource Strain: Not on file  Food Insecurity: Not on file  Transportation Needs: Not on file  Physical Activity: Not on file  Stress: Not on file  Social Connections: Not on file     Family History: The patient's family history includes Clotting disorder in her father; Heart Problems in her father; Heart attack in her mother; Heart disease in her father; Hypertension in her father and mother.  ROS:   Please see the history of present illness.    All other systems reviewed and are negative.  EKGs/Labs/Other Studies Reviewed:    The following studies were reviewed today: I discussed my findings with the patient at length.   Recent Labs: 12/14/2021: BUN 18; Creatinine, Ser 0.83; Potassium 4.4; Sodium 140 01/26/2022: ALT 21  Recent Lipid Panel    Component Value Date/Time   CHOL 182 01/26/2022 0805   TRIG 79 01/26/2022 0805   HDL 76 01/26/2022 0805   CHOLHDL 2.4 01/26/2022 0805   LDLCALC 92 01/26/2022 0805    Physical Exam:    VS:  BP 100/60   Pulse 77   Ht 5' 3.6" (1.615 m)   Wt 180  lb 12.8 oz (82 kg)   SpO2 98%   BMI 31.43 kg/m     Wt Readings from Last 3 Encounters:  06/14/22 180 lb 12.8 oz (82 kg)  12/14/21 181 lb 12.8 oz (82.5 kg)  11/01/21 184 lb 14.4 oz (83.9 kg)     GEN: Patient is in no acute distress HEENT: Normal NECK: No JVD; No carotid bruits LYMPHATICS: No lymphadenopathy CARDIAC: Hear sounds regular, 2/6 systolic murmur at the apex. RESPIRATORY:  Clear to auscultation without rales, wheezing or rhonchi  ABDOMEN: Soft, non-tender, non-distended MUSCULOSKELETAL:  No edema; No deformity  SKIN: Warm and dry NEUROLOGIC:  Alert and oriented x 3 PSYCHIATRIC:  Normal affect   Signed, Jenean Lindau, MD  06/14/2022 8:25  AM    [place]

## 2022-06-14 NOTE — Patient Instructions (Addendum)
Medication Instructions:  Your physician has recommended you make the following change in your medication:   Decrease Valsartan-hydrochlorothiazide to 160-12.5 mg daily.   *If you need a refill on your cardiac medications before your next appointment, please call your pharmacy*   Lab Work: Your physician recommends that you return for lab work in: 6 weeks You need to have labs done when you are fasting.  You can come Monday through Friday 8:30 am to 12:00 pm and 1:15 to 4:30. You do not need to make an appointment as the order has already been placed. The labs you are going to have done are BMET, LFT and Lipids.  If you have labs (blood work) drawn today and your tests are completely normal, you will receive your results only by: [place] (if you have MyChart) OR A paper copy in the mail If you have any lab test that is abnormal or we need to change your treatment, we will call you to review the results.   Testing/Procedures: None ordered   Follow-Up: At [facility], you and your health needs are our priority.  As part of our continuing mission to provide you with exceptional heart care, we have created designated Provider Care Teams.  These Care Teams include your primary Cardiologist (physician) and Advanced Practice Providers (APPs -  Physician Assistants and Nurse Practitioners) who all work together to provide you with the care you need, when you need it.  We recommend signing up for the patient portal called "MyChart".  Sign up information is provided on this After Visit Summary.  MyChart is used to connect with patients for Virtual Visits (Telemedicine).  Patients are able to view lab/test results, encounter notes, upcoming appointments, etc.  Non-urgent messages can be sent to your provider as well.   To learn more about what you can do with MyChart, go to [email or web address].    Your next appointment:   6 week(s)  The format for your next appointment:   In  Person  Provider:   Jyl Heinz, MD   Other Instructions NA

## 2022-07-09 DIAGNOSIS — Z6832 Body mass index (BMI) 32.0-32.9, adult: Secondary | ICD-10-CM | POA: Diagnosis not present

## 2022-07-09 DIAGNOSIS — Z0000 Encounter for general adult medical examination without abnormal findings: Secondary | ICD-10-CM | POA: Diagnosis not present

## 2022-07-09 DIAGNOSIS — E1169 Type 2 diabetes mellitus with other specified complication: Secondary | ICD-10-CM | POA: Diagnosis not present

## 2022-07-09 DIAGNOSIS — I2510 Atherosclerotic heart disease of native coronary artery without angina pectoris: Secondary | ICD-10-CM | POA: Diagnosis not present

## 2022-07-09 DIAGNOSIS — Z853 Personal history of malignant neoplasm of breast: Secondary | ICD-10-CM | POA: Diagnosis not present

## 2022-07-09 DIAGNOSIS — E669 Obesity, unspecified: Secondary | ICD-10-CM | POA: Diagnosis not present

## 2022-07-27 ENCOUNTER — Telehealth: Payer: Self-pay

## 2022-07-27 ENCOUNTER — Ambulatory Visit: Payer: Medicare HMO | Attending: Cardiology | Admitting: Cardiology

## 2022-07-27 ENCOUNTER — Encounter: Payer: Self-pay | Admitting: Cardiology

## 2022-07-27 VITALS — BP 100/60 | HR 68 | Ht 63.5 in | Wt 170.6 lb

## 2022-07-27 DIAGNOSIS — E785 Hyperlipidemia, unspecified: Secondary | ICD-10-CM | POA: Diagnosis not present

## 2022-07-27 DIAGNOSIS — I10 Essential (primary) hypertension: Secondary | ICD-10-CM | POA: Diagnosis not present

## 2022-07-27 DIAGNOSIS — I2510 Atherosclerotic heart disease of native coronary artery without angina pectoris: Secondary | ICD-10-CM

## 2022-07-27 DIAGNOSIS — E088 Diabetes mellitus due to underlying condition with unspecified complications: Secondary | ICD-10-CM

## 2022-07-27 MED ORDER — CARVEDILOL 3.125 MG PO TABS: 3.1250 mg | ORAL_TABLET | Freq: Two times a day (BID) | ORAL | 3 refills | Status: DC

## 2022-07-27 MED ORDER — CARVEDILOL 12.5 MG PO TABS: 12.5000 mg | ORAL_TABLET | Freq: Two times a day (BID) | ORAL | 3 refills | Status: DC

## 2022-07-27 MED ORDER — NITROGLYCERIN 0.4 MG SL SUBL: 0.4000 mg | SUBLINGUAL_TABLET | SUBLINGUAL | 11 refills | Status: DC | PRN

## 2022-07-27 NOTE — Patient Instructions (Addendum)
Medication Instructions:  Your physician has recommended you make the following change in your medication:   DECREASE: Carvedilol to 3.125 mg twice daily by mouth.    Nitroglygerin: Use nitroglycerin 1 tablet placed under the tongue at the first sign of chest pain or an angina attack. 1 tablet may be used every 5 minutes as needed, for up to 15 minutes. Do not take more than 3 tablets in 15 minutes. If pain persist call 911 or go to the nearest ED.      Lab Work: BMP, LFT's, Lipids- today If you have labs (blood work) drawn today and your tests are completely normal, you will receive your results only by: [place] (if you have MyChart) OR A paper copy in the mail If you have any lab test that is abnormal or we need to change your treatment, we will call you to review the results.   Testing/Procedures: None Ordered   Follow-Up: At [facility], you and your health needs are our priority.  As part of our continuing mission to provide you with exceptional heart care, we have created designated Provider Care Teams.  These Care Teams include your primary Cardiologist (physician) and Advanced Practice Providers (APPs -  Physician Assistants and Nurse Practitioners) who all work together to provide you with the care you need, when you need it.  We recommend signing up for the patient portal called "MyChart".  Sign up information is provided on this After Visit Summary.  MyChart is used to connect with patients for Virtual Visits (Telemedicine).  Patients are able to view lab/test results, encounter notes, upcoming appointments, etc.  Non-urgent messages can be sent to your provider as well.   To learn more about what you can do with MyChart, go to [email or web address].    Your next appointment:   6 month(s)  The format for your next appointment:   In Person  Provider:   Jyl Heinz, MD    Other Instructions Check blood pressures at home- bring by BP log in 2 weeks

## 2022-07-27 NOTE — Addendum Note (Signed)
Addended by: Jacobo Forest D on: 07/27/2022 09:19 AM   Modules accepted: Orders

## 2022-07-27 NOTE — Progress Notes (Signed)
Cardiology Office Note:    Date:  07/27/2022   ID:  [name], DOB 1946/07/27, MRN [id number]  PCP:  Greig Right, MD  Cardiologist:  Jenean Lindau, MD   Referring MD: Greig Right, MD    ASSESSMENT:    1. Coronary artery disease involving native coronary artery of native heart without angina pectoris   2. Essential hypertension   3. Diabetes mellitus due to underlying condition with unspecified complications ([place])   4. Dyslipidemia    PLAN:    In order of problems listed above:  Coronary artery disease: Secondary prevention stressed with the patient.  Importance of compliance with diet medication stressed and she vocalized understanding.  She was advised to walk at least half an hour a day 5 days a week and she promises to do so. Essential hypertension: Blood pressure stable.  Her blood pressure is borderline and she has history of fatigue so we will cut down carvedilol to half dose 12.5 mg twice daily.  She will keep a track of her blood pressures and get back to [place]. Mixed dyslipidemia: Lipids will be checked today.  Her lipids are very elevated she has done well with diet and exercise. Diabetes mellitus: Diet emphasized.  This is managed by primary care. Obesity: Weight reduction stressed she is done very well with exercise diet and weight loss and will continue this trend. Patient will be seen in follow-up appointment in 6 months or earlier if the patient has any concerns    Medication Adjustments/Labs and Tests Ordered: Current medicines are reviewed at length with the patient today.  Concerns regarding medicines are outlined above.  No orders of the defined types were placed in this encounter.  No orders of the defined types were placed in this encounter.    Chief Complaint  Patient presents with   Fatigue   low BP     History of Present Illness:    Tanya Ayers is a 77 y.o. female.  Patient has past medical history of coronary artery disease,  essential hypertension, dyslipidemia and diabetes mellitus.  She denies any problems at this time except feeling tired and fatigued.  No chest pain orthopnea or PND.  She walks on a regular basis without any symptoms.  At the time of my evaluation, the patient is alert awake oriented and in no distress.  Past Medical History:  Diagnosis Date   Acquired absence of right breast 11/14/2016   BMI 32.0-32.9,adult 04/16/2016   Carcinoma of right breast metastatic to axillary lymph node ([place]) 04/16/2016   Coronary artery disease involving native coronary artery of native heart without angina pectoris 10/04/2016   Diabetes mellitus due to underlying condition with unspecified complications ([place]) 01/17/9797   Dyslipidemia 02/09/2016   Essential hypertension 02/09/2016   Hypertension     Past Surgical History:  Procedure Laterality Date   MASTECTOMY      Current Medications: Current Meds  Medication Sig   ascorbic acid (VITAMIN C) 500 MG tablet Take 500 mg by mouth daily.   aspirin EC 81 MG tablet Take 1 tablet (81 mg total) by mouth daily. Swallow whole.   Calcium Polycarbophil (FIBER) 625 MG TABS Take 625 mg by mouth daily.   carvedilol (COREG) 25 MG tablet Take 25 mg by mouth 2 (two) times daily with a meal.   Cholecalciferol 25 MCG (1000 UT) capsule Take 1,000 Units by mouth daily.   co-enzyme Q-10 30 MG capsule Take 30 mg by mouth daily.   Glucosamine-Chondroitin (  GLUCOSAMINE CHONDR COMPLEX PO) Take 1 tablet by mouth 2 (two) times daily.   meloxicam (MOBIC) 15 MG tablet Take 15 mg by mouth as needed for pain.   metFORMIN (GLUCOPHAGE) 500 MG tablet Take 500 mg by mouth daily.   Multiple Vitamin (MULTIVITAMIN) capsule Take 1 capsule by mouth daily.    nitroGLYCERIN (NITROSTAT) 0.4 MG SL tablet Place 1 tablet (0.4 mg total) under the tongue every 5 (five) minutes as needed for chest pain.   Omega-3 Fatty Acids (FISH OIL) 1000 MG CAPS Take 1,000 mg by mouth daily.   omeprazole (PRILOSEC) 20 MG  capsule Take 20 mg by mouth daily.   pravastatin (PRAVACHOL) 20 MG tablet Take 40 mg by mouth at bedtime.   valsartan-hydrochlorothiazide (DIOVAN HCT) 160-12.5 MG tablet Take 1 tablet by mouth daily.   vitamin E 180 MG (400 UNITS) capsule Take 400 Units by mouth daily.     Allergies:   Atorvastatin, Latex, Rosuvastatin, and Tape   Social History   Socioeconomic History   Marital status: Married    Spouse name: Not on file   Number of children: Not on file   Years of education: Not on file   Highest education level: Not on file  Occupational History   Not on file  Tobacco Use   Smoking status: Never   Smokeless tobacco: Never  Vaping Use   Vaping Use: Never used  Substance and Sexual Activity   Alcohol use: No   Drug use: No   Sexual activity: Not on file  Other Topics Concern   Not on file  Social History Narrative   Not on file   Social Determinants of Health   Financial Resource Strain: Not on file  Food Insecurity: Not on file  Transportation Needs: Not on file  Physical Activity: Not on file  Stress: Not on file  Social Connections: Not on file     Family History: The patient's family history includes Clotting disorder in her father; Heart Problems in her father; Heart attack in her mother; Heart disease in her father; Hypertension in her father and mother.  ROS:   Please see the history of present illness.    All other systems reviewed and are negative.  EKGs/Labs/Other Studies Reviewed:    The following studies were reviewed today: I discussed my findings with the patient at length.   Recent Labs: 12/14/2021: BUN 18; Creatinine, Ser 0.83; Potassium 4.4; Sodium 140 01/26/2022: ALT 21  Recent Lipid Panel    Component Value Date/Time   CHOL 182 01/26/2022 0805   TRIG 79 01/26/2022 0805   HDL 76 01/26/2022 0805   CHOLHDL 2.4 01/26/2022 0805   LDLCALC 92 01/26/2022 0805    Physical Exam:    VS:  BP 100/60 (BP Location: Left Arm)   Pulse 68   Ht 5'  3.5" (1.613 m)   Wt 170 lb 9.6 oz (77.4 kg)   SpO2 95%   BMI 29.75 kg/m     Wt Readings from Last 3 Encounters:  07/27/22 170 lb 9.6 oz (77.4 kg)  06/14/22 180 lb 12.8 oz (82 kg)  12/14/21 181 lb 12.8 oz (82.5 kg)     GEN: Patient is in no acute distress HEENT: Normal NECK: No JVD; No carotid bruits LYMPHATICS: No lymphadenopathy CARDIAC: Hear sounds regular, 2/6 systolic murmur at the apex. RESPIRATORY:  Clear to auscultation without rales, wheezing or rhonchi  ABDOMEN: Soft, non-tender, non-distended MUSCULOSKELETAL:  No edema; No deformity  SKIN: Warm and dry NEUROLOGIC:  Alert and oriented x 3 PSYCHIATRIC:  Normal affect   Signed, Jenean Lindau, MD  07/27/2022 8:12 AM    [place]

## 2022-07-27 NOTE — Telephone Encounter (Signed)
In speaking with pt at check out she stated that she was taking Carvedilol 12.'5mg'$  only ONCE daily. Dr. Geraldo Pitter recommended that she decrease to 3.125 BID. Called pt to make her aware of the change. Sent new prescription to pharmacy. Pt verbalized understanding and had no further questions.

## 2022-07-28 LAB — BASIC METABOLIC PANEL
BUN/Creatinine Ratio: 18 (ref 12–28)
BUN: 22 mg/dL (ref 8–27)
CO2: 26 mmol/L (ref 20–29)
Calcium: 9.6 mg/dL (ref 8.7–10.3)
Chloride: 99 mmol/L (ref 96–106)
Creatinine, Ser: 1.21 mg/dL — ABNORMAL HIGH (ref 0.57–1.00)
Glucose: 124 mg/dL — ABNORMAL HIGH (ref 70–99)
Potassium: 4.7 mmol/L (ref 3.5–5.2)
Sodium: 139 mmol/L (ref 134–144)
eGFR: 47 mL/min/{1.73_m2} — ABNORMAL LOW (ref >59)

## 2022-07-28 LAB — HEPATIC FUNCTION PANEL
ALT: 16 IU/L (ref 0–32)
AST: 41 IU/L — ABNORMAL HIGH (ref 0–40)
Albumin: 4.2 g/dL (ref 3.8–4.8)
Alkaline Phosphatase: 130 IU/L — ABNORMAL HIGH (ref 44–121)
Bilirubin Total: 0.5 mg/dL (ref 0.0–1.2)
Bilirubin, Direct: 0.18 mg/dL (ref 0.00–0.40)
Total Protein: 7 g/dL (ref 6.0–8.5)

## 2022-07-28 LAB — LIPID PANEL
Chol/HDL Ratio: 4.6 ratio — ABNORMAL HIGH (ref 0.0–4.4)
Cholesterol, Total: 172 mg/dL (ref 100–199)
HDL: 37 mg/dL — ABNORMAL LOW (ref >39)
LDL Chol Calc (NIH): 108 mg/dL — ABNORMAL HIGH (ref 0–99)
Triglycerides: 154 mg/dL — ABNORMAL HIGH (ref 0–149)
VLDL Cholesterol Cal: 27 mg/dL (ref 5–40)

## 2022-08-02 NOTE — Addendum Note (Signed)
Addended by: Truddie Hidden on: 08/02/2022 07:36 AM   Modules accepted: Orders

## 2022-09-13 DIAGNOSIS — Z7984 Long term (current) use of oral hypoglycemic drugs: Secondary | ICD-10-CM | POA: Diagnosis not present

## 2022-09-13 DIAGNOSIS — H43813 Vitreous degeneration, bilateral: Secondary | ICD-10-CM | POA: Diagnosis not present

## 2022-09-13 DIAGNOSIS — H5202 Hypermetropia, left eye: Secondary | ICD-10-CM | POA: Diagnosis not present

## 2022-09-13 DIAGNOSIS — E119 Type 2 diabetes mellitus without complications: Secondary | ICD-10-CM | POA: Diagnosis not present

## 2022-09-19 DIAGNOSIS — Z13820 Encounter for screening for osteoporosis: Secondary | ICD-10-CM | POA: Diagnosis not present

## 2022-09-19 DIAGNOSIS — M8589 Other specified disorders of bone density and structure, multiple sites: Secondary | ICD-10-CM | POA: Diagnosis not present

## 2022-09-19 DIAGNOSIS — Z23 Encounter for immunization: Secondary | ICD-10-CM | POA: Diagnosis not present

## 2022-10-31 NOTE — Progress Notes (Signed)
[facility]  [address] [place],  [place]  [address] [phone]  Clinic Day:  11/01/2022  Referring physician: Greig Right, MD  HISTORY OF PRESENT ILLNESS:  The patient is a 76 y.o. female with pathologic stage IIIA (T0 N2a M0) triple negative breast cancer, which included regional lymph node involvement of her right axillary and subpectoral regions.  Of note, a breast MRI never revealed a primary right breast mass.  Furthermore, the patient had CT scans of her chest, abdomen, and pelvis which never showed occult disease metastasis. The patient received 6 cycles of neoadjuvant TAC chemotherapy before undergoing a right mastectomy in November 2016.  The pathology from this surgery still showed no obvious primary breast mass, but 9/27 axillary lymph nodes removed contained cancer.  She then underwent radiation to her right chest wall/axillary region.  Since that time, all follow-up exams and mammograms have never shown any evidence of disease recurrence.  She comes in today for routine followup.  Since her last visit, the patient has been doing very well.  She continues to deny having any new symptoms or findings which concern her for recurrent breast cancer.  Of note, her annual mammogram in June 2023 showed no evidence of disease recurrence.  PHYSICAL EXAM:  Blood pressure (!) 148/70, pulse 74, temperature 98.7 F (37.1 C), temperature source Oral, resp. rate 18, height 5' 3.5" (1.613 m), weight 156 lb 14.4 oz (71.2 kg), SpO2 97 %. Wt Readings from Last 3 Encounters:  11/01/22 156 lb 14.4 oz (71.2 kg)  07/27/22 170 lb 9.6 oz (77.4 kg)  06/14/22 180 lb 12.8 oz (82 kg)   Body mass index is 27.36 kg/m. Performance status (ECOG): 0 - Asymptomatic Physical Exam Constitutional:      Appearance: Normal appearance.  HENT:     Mouth/Throat:     Pharynx: Oropharynx is clear. No oropharyngeal exudate.  Cardiovascular:     Rate and Rhythm: Normal rate and  regular rhythm.     Heart sounds: No murmur heard.    No friction rub. No gallop.  Pulmonary:     Breath sounds: Normal breath sounds.  Chest:  Breasts:    Right: Absent. No swelling, bleeding, inverted nipple, mass, nipple discharge or skin change.     Left: No swelling, bleeding, inverted nipple, mass, nipple discharge or skin change.  Abdominal:     General: Bowel sounds are normal. There is no distension.     Palpations: Abdomen is soft. There is no mass.     Tenderness: There is no abdominal tenderness.  Musculoskeletal:        General: No tenderness.     Cervical back: Normal range of motion and neck supple.     Right lower leg: No edema.     Left lower leg: No edema.  Lymphadenopathy:     Cervical: No cervical adenopathy.     Right cervical: No superficial, deep or posterior cervical adenopathy.    Left cervical: No superficial, deep or posterior cervical adenopathy.     Upper Body:     Right upper body: No supraclavicular or axillary adenopathy.     Left upper body: No supraclavicular or axillary adenopathy.     Lower Body: No right inguinal adenopathy. No left inguinal adenopathy.  Skin:    Coloration: Skin is not jaundiced.     Findings: No lesion or rash.  Neurological:     General: No focal deficit present.     Mental Status: She  is alert and oriented to person, place, and time. Mental status is at baseline.  Psychiatric:        Mood and Affect: Mood normal.        Behavior: Behavior normal.        Thought Content: Thought content normal.        Judgment: Judgment normal.    ASSESSMENT & PLAN:  Assessment/Plan:  A 76 y.o. female with pathologic stage IIIA (T0 N2a M0) triple negative breast cancer, status post neoadjuvant chemotherapy, a right mastectomy, and adjuvant breast radiation.  She is now over 7 years out from her right mastectomy.  Based upon her clinical breast exam today and recent mammogram, the patient remains disease-free.  Clinically, she appears to  be doing very well.  As that is the case, I will see her back in 1 year.  Her annual mammogram will be scheduled before her next visit for her continued radiographic breast cancer surveillance. The patient understands all the plans discussed today and is in agreement with them.    Alaysiah Browder Macarthur Critchley, MD

## 2022-11-01 ENCOUNTER — Inpatient Hospital Stay: Payer: Medicare HMO | Attending: Oncology | Admitting: Oncology

## 2022-11-01 ENCOUNTER — Other Ambulatory Visit: Payer: Self-pay | Admitting: Oncology

## 2022-11-01 VITALS — BP 148/70 | HR 74 | Temp 98.7°F | Resp 18 | Ht 63.5 in | Wt 156.9 lb

## 2022-11-01 DIAGNOSIS — C50911 Malignant neoplasm of unspecified site of right female breast: Secondary | ICD-10-CM

## 2022-11-01 DIAGNOSIS — C773 Secondary and unspecified malignant neoplasm of axilla and upper limb lymph nodes: Secondary | ICD-10-CM

## 2022-11-06 ENCOUNTER — Telehealth: Payer: Self-pay | Admitting: Dietician

## 2022-11-06 NOTE — Telephone Encounter (Signed)
Patient screened on MST. First attempt to reach. Provided my cell# on voice mail and in text message to return call for a nutrition consult.  April Manson, RDN, LDN Registered Dietitian, [place] Part Time Remote (Usual office hours: Tuesday-Thursday) Cell: [phone]

## 2022-11-08 DIAGNOSIS — E7800 Pure hypercholesterolemia, unspecified: Secondary | ICD-10-CM | POA: Diagnosis not present

## 2022-11-08 DIAGNOSIS — Z6828 Body mass index (BMI) 28.0-28.9, adult: Secondary | ICD-10-CM | POA: Diagnosis not present

## 2022-11-08 DIAGNOSIS — E1169 Type 2 diabetes mellitus with other specified complication: Secondary | ICD-10-CM | POA: Diagnosis not present

## 2022-11-08 DIAGNOSIS — M791 Myalgia, unspecified site: Secondary | ICD-10-CM | POA: Diagnosis not present

## 2022-11-08 DIAGNOSIS — E663 Overweight: Secondary | ICD-10-CM | POA: Diagnosis not present

## 2022-11-08 DIAGNOSIS — I10 Essential (primary) hypertension: Secondary | ICD-10-CM | POA: Diagnosis not present

## 2022-11-08 DIAGNOSIS — K219 Gastro-esophageal reflux disease without esophagitis: Secondary | ICD-10-CM | POA: Diagnosis not present

## 2022-12-31 DIAGNOSIS — H401220 Low-tension glaucoma, left eye, stage unspecified: Secondary | ICD-10-CM | POA: Diagnosis not present

## 2023-01-14 ENCOUNTER — Telehealth: Payer: Self-pay | Admitting: Cardiology

## 2023-01-14 NOTE — Telephone Encounter (Signed)
Pt c/o BP issue: STAT if pt c/o blurred vision, one-sided weakness or slurred speech  1. What are your last 5 BP readings?   115/56 - 2/19 114/58 - 2/18 117/48 - 2/17 122/36 - 2/16 122/56 - 2/15  2. Are you having any other symptoms (ex. Dizziness, headache, blurred vision, passed out)?     Extremely fatigued, a little blurred vision, a little nauseous  3. What is your BP issue?   Patient stated she has lost 30 lbs since her last visit and approximate weight now is 151 lbs.  Patient is concerned she may need to change her BP medication.  Patient noted she has been taking Delsym and Claritin for a dry cough.

## 2023-01-15 DIAGNOSIS — R5381 Other malaise: Secondary | ICD-10-CM | POA: Diagnosis not present

## 2023-01-15 DIAGNOSIS — R911 Solitary pulmonary nodule: Secondary | ICD-10-CM | POA: Diagnosis not present

## 2023-01-15 DIAGNOSIS — I2782 Chronic pulmonary embolism: Secondary | ICD-10-CM | POA: Diagnosis not present

## 2023-01-15 DIAGNOSIS — R0600 Dyspnea, unspecified: Secondary | ICD-10-CM | POA: Diagnosis not present

## 2023-01-15 DIAGNOSIS — D491 Neoplasm of unspecified behavior of respiratory system: Secondary | ICD-10-CM | POA: Diagnosis not present

## 2023-01-15 DIAGNOSIS — R918 Other nonspecific abnormal finding of lung field: Secondary | ICD-10-CM | POA: Diagnosis not present

## 2023-01-15 DIAGNOSIS — C50919 Malignant neoplasm of unspecified site of unspecified female breast: Secondary | ICD-10-CM | POA: Diagnosis not present

## 2023-01-15 DIAGNOSIS — R9431 Abnormal electrocardiogram [ECG] [EKG]: Secondary | ICD-10-CM | POA: Diagnosis not present

## 2023-01-15 DIAGNOSIS — R0602 Shortness of breath: Secondary | ICD-10-CM | POA: Diagnosis not present

## 2023-01-15 NOTE — Telephone Encounter (Signed)
Pt states that her heart rate is 82-94. Pt reports feeling better and has an appointment with her PCP this AM.

## 2023-01-16 ENCOUNTER — Telehealth: Payer: Self-pay | Admitting: Cardiology

## 2023-01-16 ENCOUNTER — Telehealth: Payer: Self-pay | Admitting: Oncology

## 2023-01-16 DIAGNOSIS — R0600 Dyspnea, unspecified: Secondary | ICD-10-CM | POA: Diagnosis not present

## 2023-01-16 DIAGNOSIS — R918 Other nonspecific abnormal finding of lung field: Secondary | ICD-10-CM | POA: Diagnosis not present

## 2023-01-16 NOTE — Telephone Encounter (Signed)
Patient has been scheduled. Aware of appt date and time   F/U APPT Received: Today Velora Heckler, CMA  [facility] Scheduling PLEASE CALL PATIENT AND ADVISE THAT I HAVE HER CT REPORT AND WE CAN SEE HER TOMORROW AFTERNOON @ 3:00 IF SHE WOULD LIKE.  IF SO, PLEASE ADD TO SCHEDULE.  I DO NOT THINK WE WILL NEED LABS AS SHE WAS SEEN IN THE ED.  THANKS

## 2023-01-16 NOTE — Telephone Encounter (Signed)
FYI Spoke with pt who states while at her PCP office yesterday she became very short of breath and was sent to the ED. Pt states that she had a chest CT which showed some areas of concern in her lungs. Pt has an appointment with pulmonary today and oncology tomorrow. Report has been printed and placed in your folder.

## 2023-01-16 NOTE — Progress Notes (Signed)
[facility]  [address] [place],  [place]  [address] [phone]  Clinic Day:  01/17/2023  Referring physician: Greig Right, MD  HISTORY OF PRESENT ILLNESS:  The patient is a 77 y.o. female with pathologic stage IIIA (T0 N2a M0) triple negative breast cancer, which included regional lymph node involvement of her right axillary and subpectoral regions.  Of note, a breast MRI never revealed a primary right breast mass.  Furthermore, the patient had CT scans of her chest, abdomen, and pelvis which never showed occult disease metastasis. The patient received 6 cycles of neoadjuvant TAC chemotherapy before undergoing a right mastectomy in November 2016.  The pathology from this surgery still showed no obvious primary breast mass, but 9/27 axillary lymph nodes removed contained cancer.  She then underwent radiation to her right chest wall/axillary region.  Since that time, all follow-up exams and mammograms have never shown any evidence of disease recurrence.  However, a chest CT showed bilateral pulmonary nodules     She comes in today for routine followup.  Since her last visit, the patient has been doing very well.  She continues to deny having any new symptoms or findings which concern her for recurrent breast cancer.  Of note, her annual mammogram in June 2023 showed no evidence of disease recurrence.  PHYSICAL EXAM:  There were no vitals taken for this visit. Wt Readings from Last 3 Encounters:  11/01/22 156 lb 14.4 oz (71.2 kg)  07/27/22 170 lb 9.6 oz (77.4 kg)  06/14/22 180 lb 12.8 oz (82 kg)   There is no height or weight on file to calculate BMI. Performance status (ECOG): 0 - Asymptomatic Physical Exam Constitutional:      Appearance: Normal appearance.  HENT:     Mouth/Throat:     Pharynx: Oropharynx is clear. No oropharyngeal exudate.  Cardiovascular:     Rate and Rhythm: Normal rate and regular rhythm.     Heart sounds: No murmur  heard.    No friction rub. No gallop.  Pulmonary:     Breath sounds: Normal breath sounds.  Chest:  Breasts:    Right: Absent. No swelling, bleeding, inverted nipple, mass, nipple discharge or skin change.     Left: No swelling, bleeding, inverted nipple, mass, nipple discharge or skin change.  Abdominal:     General: Bowel sounds are normal. There is no distension.     Palpations: Abdomen is soft. There is no mass.     Tenderness: There is no abdominal tenderness.  Musculoskeletal:        General: No tenderness.     Cervical back: Normal range of motion and neck supple.     Right lower leg: No edema.     Left lower leg: No edema.  Lymphadenopathy:     Cervical: No cervical adenopathy.     Right cervical: No superficial, deep or posterior cervical adenopathy.    Left cervical: No superficial, deep or posterior cervical adenopathy.     Upper Body:     Right upper body: No supraclavicular or axillary adenopathy.     Left upper body: No supraclavicular or axillary adenopathy.     Lower Body: No right inguinal adenopathy. No left inguinal adenopathy.  Skin:    Coloration: Skin is not jaundiced.     Findings: No lesion or rash.  Neurological:     General: No focal deficit present.     Mental Status: She is alert and oriented to person, place,  and time. Mental status is at baseline.  Psychiatric:        Mood and Affect: Mood normal.        Behavior: Behavior normal.        Thought Content: Thought content normal.        Judgment: Judgment normal.  SCANS:  Her chest CT revealed the following : FINDINGS: Cardiovascular: No acute pulmonary embolus is identified. However, there is a thin web like structure in the right lower lobe pulmonary artery on images 120-127 of series 3 compatible with a small chronic pulmonary embolus.  Coronary, aortic arch, and branch vessel atherosclerotic vascular disease.  Mediastinum/Nodes: Right neck level III lymph node 1.0 cm in short axis on  image 6 series 4.. Left supraclavicular node 0.7 cm in short axis, image 17 series 4. Left axillary node 1.6 cm in short axis on image 50 series 4, previously not enlarged. Previous right axillary lymph node dissection.  Lungs/Pleura: Eight new scattered pulmonary nodules in the lungs are highly suspicious for metastatic disease. Index left lower lobe nodule 1.6 by 1.5 cm on image 78 series 5. Some of the nodules are pleural based including a 1.6 by 1.0 cm nodule along the right posterior costophrenic angle.  Trace bilateral pleural effusions.  Upper Abdomen: We partially image a 1.8 cm gallstone in the gallbladder.  Suspected splenomegaly although the spleen is not completely imaged on today's exam.  Portacaval node 1.4 cm in short axis on image 113 series 4, previously 0.9 cm. Porta hepatis node 1.1 cm in short axis on image 111 series 4, previously 0.9 cm. Partial visualization of a probably enlarged left periaortic lymph node just below the level of the left adrenal gland, included portion 1.0 cm in short axis on image 119 series 4.  Musculoskeletal: Right mastectomy. Thoracic spondylosis. Misaligned left acromioclavicular joint compatible with prior AC joint dislocation.  Review of the MIP images confirms the above findings.  IMPRESSION: 1. No acute pulmonary embolus is identified. However, there is a thin web like structure in the right lower lobe pulmonary artery compatible with a small chronic pulmonary embolus. 2. Eight new scattered pulmonary nodules in the lungs are highly suspicious for metastatic disease. Index left lower lobe nodule 1.6 by 1.5 cm. Some of the nodules are pleural based including a 1.6 by 1.0 cm nodule along the right posterior costophrenic angle. 3. Newly pathologic adenopathy in the lower neck, left axilla, and upper abdomen. 4. Trace bilateral pleural effusions. 5. Suspected splenomegaly although the spleen is not completely imaged on  today's exam. 6. Cholelithiasis. 7. Misaligned left AC joint compatible with prior AC joint dislocation. 8. Aortic atherosclerosis and coronary.  Aortic Atherosclerosis (ICD10-I70.0). ASSESSMENT & PLAN:  Assessment/Plan:  A 77 y.o. female with pathologic stage IIIA (T0 N2a M0) triple negative breast cancer, status post neoadjuvant chemotherapy, a right mastectomy, and adjuvant breast radiation.  She is now over 7 years out from her right mastectomy.  Based upon her clinical breast exam today and recent mammogram, the patient remains disease-free.  Clinically, she appears to be doing very well.  As that is the case, I will see her back in 1 year.  Her annual mammogram will be scheduled before her next visit for her continued radiographic breast cancer surveillance. The patient understands all the plans discussed today and is in agreement with them.    Zeno Hickel Macarthur Critchley, MD

## 2023-01-16 NOTE — Telephone Encounter (Signed)
Patient called to let dr Geraldo Pitter know that the patient cancer may have come back. Please advise

## 2023-01-17 ENCOUNTER — Other Ambulatory Visit: Payer: Self-pay | Admitting: Pharmacist

## 2023-01-17 ENCOUNTER — Inpatient Hospital Stay: Payer: Medicare HMO | Attending: Oncology | Admitting: Oncology

## 2023-01-17 ENCOUNTER — Other Ambulatory Visit: Payer: Self-pay | Admitting: Oncology

## 2023-01-17 ENCOUNTER — Inpatient Hospital Stay: Payer: Medicare HMO

## 2023-01-17 VITALS — BP 131/51 | HR 78 | Temp 97.9°F | Resp 18 | Ht 63.5 in

## 2023-01-17 DIAGNOSIS — C50911 Malignant neoplasm of unspecified site of right female breast: Secondary | ICD-10-CM | POA: Diagnosis present

## 2023-01-17 DIAGNOSIS — C773 Secondary and unspecified malignant neoplasm of axilla and upper limb lymph nodes: Secondary | ICD-10-CM | POA: Diagnosis not present

## 2023-01-17 DIAGNOSIS — R110 Nausea: Secondary | ICD-10-CM

## 2023-01-17 DIAGNOSIS — Z79899 Other long term (current) drug therapy: Secondary | ICD-10-CM | POA: Insufficient documentation

## 2023-01-17 DIAGNOSIS — R161 Splenomegaly, not elsewhere classified: Secondary | ICD-10-CM | POA: Insufficient documentation

## 2023-01-17 DIAGNOSIS — Z171 Estrogen receptor negative status [ER-]: Secondary | ICD-10-CM | POA: Diagnosis not present

## 2023-01-17 DIAGNOSIS — Z9011 Acquired absence of right breast and nipple: Secondary | ICD-10-CM | POA: Insufficient documentation

## 2023-01-17 DIAGNOSIS — Z9221 Personal history of antineoplastic chemotherapy: Secondary | ICD-10-CM | POA: Diagnosis not present

## 2023-01-17 DIAGNOSIS — Z923 Personal history of irradiation: Secondary | ICD-10-CM | POA: Diagnosis not present

## 2023-01-17 DIAGNOSIS — R918 Other nonspecific abnormal finding of lung field: Secondary | ICD-10-CM | POA: Diagnosis not present

## 2023-01-17 LAB — BASIC METABOLIC PANEL
BUN: 23 — AB (ref 4–21)
CO2: 20 (ref 13–22)
Chloride: 94 — AB (ref 99–108)
Creatinine: 1.4 — AB (ref 0.5–1.1)
Glucose: 141
Potassium: 3.4 mEq/L — AB (ref 3.5–5.1)
Sodium: 126 — AB (ref 137–147)

## 2023-01-17 LAB — COMPREHENSIVE METABOLIC PANEL
Albumin: 3.1 — AB (ref 3.5–5.0)
Calcium: 9.2 (ref 8.7–10.7)

## 2023-01-17 LAB — HEPATIC FUNCTION PANEL
ALT: 22 U/L (ref 7–35)
AST: 91 — AB (ref 13–35)
Alkaline Phosphatase: 173 — AB (ref 25–125)
Bilirubin, Total: 1

## 2023-01-17 LAB — IRON AND TIBC
Iron: 75 ug/dL (ref 28–170)
Saturation Ratios: 26 % (ref 10.4–31.8)
TIBC: 290 ug/dL (ref 250–450)
UIBC: 215 ug/dL

## 2023-01-17 LAB — FOLATE: Folate: >40 ng/mL (ref >5.9)

## 2023-01-17 LAB — FERRITIN: Ferritin: 387 ng/mL — ABNORMAL HIGH (ref 11–307)

## 2023-01-17 LAB — VITAMIN B12: Vitamin B-12: 1037 pg/mL — ABNORMAL HIGH (ref 180–914)

## 2023-01-17 MED ORDER — ONDANSETRON HCL 4 MG/2ML IJ SOLN
4.0000 mg | Freq: Once | INTRAMUSCULAR | Status: AC
  Administered 2023-01-17: 4 mg via INTRAMUSCULAR

## 2023-01-18 ENCOUNTER — Other Ambulatory Visit: Payer: Self-pay | Admitting: Oncology

## 2023-01-18 ENCOUNTER — Inpatient Hospital Stay: Payer: Medicare HMO

## 2023-01-18 ENCOUNTER — Other Ambulatory Visit (INDEPENDENT_AMBULATORY_CARE_PROVIDER_SITE_OTHER): Payer: Medicare HMO | Admitting: Oncology

## 2023-01-18 ENCOUNTER — Inpatient Hospital Stay (INDEPENDENT_AMBULATORY_CARE_PROVIDER_SITE_OTHER): Payer: Medicare HMO | Admitting: Oncology

## 2023-01-18 VITALS — BP 117/54 | HR 89 | Temp 99.7°F | Resp 14 | Ht 63.5 in

## 2023-01-18 DIAGNOSIS — Z171 Estrogen receptor negative status [ER-]: Secondary | ICD-10-CM | POA: Diagnosis not present

## 2023-01-18 DIAGNOSIS — R918 Other nonspecific abnormal finding of lung field: Secondary | ICD-10-CM | POA: Diagnosis not present

## 2023-01-18 DIAGNOSIS — Z9221 Personal history of antineoplastic chemotherapy: Secondary | ICD-10-CM | POA: Diagnosis not present

## 2023-01-18 DIAGNOSIS — T451X5A Adverse effect of antineoplastic and immunosuppressive drugs, initial encounter: Secondary | ICD-10-CM | POA: Diagnosis not present

## 2023-01-18 DIAGNOSIS — C50911 Malignant neoplasm of unspecified site of right female breast: Secondary | ICD-10-CM

## 2023-01-18 DIAGNOSIS — D469 Myelodysplastic syndrome, unspecified: Secondary | ICD-10-CM | POA: Diagnosis not present

## 2023-01-18 DIAGNOSIS — D6481 Anemia due to antineoplastic chemotherapy: Secondary | ICD-10-CM

## 2023-01-18 DIAGNOSIS — Z79899 Other long term (current) drug therapy: Secondary | ICD-10-CM | POA: Diagnosis not present

## 2023-01-18 DIAGNOSIS — R161 Splenomegaly, not elsewhere classified: Secondary | ICD-10-CM | POA: Diagnosis not present

## 2023-01-18 DIAGNOSIS — Z923 Personal history of irradiation: Secondary | ICD-10-CM | POA: Diagnosis not present

## 2023-01-18 DIAGNOSIS — Z9011 Acquired absence of right breast and nipple: Secondary | ICD-10-CM | POA: Diagnosis not present

## 2023-01-18 LAB — ABO/RH: ABO/RH(D): B NEG

## 2023-01-18 LAB — CBC AND DIFFERENTIAL
HCT: 23 — AB (ref 36–46)
Hemoglobin: 7.9 — AB (ref 12.0–16.0)
Neutrophils Absolute: 3.27
Platelets: 67 10*3/uL — AB (ref 150–400)
WBC: 4.3

## 2023-01-18 LAB — PREPARE RBC (CROSSMATCH)

## 2023-01-18 LAB — CBC: RBC: 2.85 — AB (ref 3.87–5.11)

## 2023-01-18 NOTE — Progress Notes (Signed)
Due to progressive cytopenias and a history of breast cancer for which adriamycin-based chemotherapy was used, a bone marrow biopsy was done for further evaluation.  After consenting for the procedure, the patient's left posterior superior iliac crest was topically sterilized.  Afterwards, a bone marrow core was collected, which can hopefully be sent for flow cytometry, AML FISH panel and cytogenetics.  Likely due to a packed marrow, an aspirate could not be collected.  There were no complications experienced with this procedure.

## 2023-01-19 ENCOUNTER — Inpatient Hospital Stay: Payer: Medicare HMO

## 2023-01-19 DIAGNOSIS — Z79899 Other long term (current) drug therapy: Secondary | ICD-10-CM | POA: Diagnosis not present

## 2023-01-19 DIAGNOSIS — Z9221 Personal history of antineoplastic chemotherapy: Secondary | ICD-10-CM | POA: Diagnosis not present

## 2023-01-19 DIAGNOSIS — C773 Secondary and unspecified malignant neoplasm of axilla and upper limb lymph nodes: Secondary | ICD-10-CM

## 2023-01-19 DIAGNOSIS — R918 Other nonspecific abnormal finding of lung field: Secondary | ICD-10-CM | POA: Diagnosis not present

## 2023-01-19 DIAGNOSIS — C50911 Malignant neoplasm of unspecified site of right female breast: Secondary | ICD-10-CM | POA: Diagnosis not present

## 2023-01-19 DIAGNOSIS — Z923 Personal history of irradiation: Secondary | ICD-10-CM | POA: Diagnosis not present

## 2023-01-19 DIAGNOSIS — R161 Splenomegaly, not elsewhere classified: Secondary | ICD-10-CM | POA: Diagnosis not present

## 2023-01-19 DIAGNOSIS — Z171 Estrogen receptor negative status [ER-]: Secondary | ICD-10-CM | POA: Diagnosis not present

## 2023-01-19 DIAGNOSIS — Z9011 Acquired absence of right breast and nipple: Secondary | ICD-10-CM | POA: Diagnosis not present

## 2023-01-19 MED ORDER — ACETAMINOPHEN 325 MG PO TABS
650.0000 mg | ORAL_TABLET | Freq: Once | ORAL | Status: AC
  Administered 2023-01-19: 650 mg via ORAL
  Filled 2023-01-19: qty 2

## 2023-01-19 MED ORDER — SODIUM CHLORIDE 0.9 % IV SOLN: 8.0000 mg | Freq: Once | INTRAVENOUS | Status: DC

## 2023-01-19 MED ORDER — SODIUM CHLORIDE 0.9% IV SOLUTION
250.0000 mL | Freq: Once | INTRAVENOUS | Status: AC
  Administered 2023-01-19: 250 mL via INTRAVENOUS

## 2023-01-19 MED ORDER — ONDANSETRON HCL 4 MG/2ML IJ SOLN
8.0000 mg | Freq: Once | INTRAMUSCULAR | Status: AC
  Administered 2023-01-19: 8 mg via INTRAVENOUS
  Filled 2023-01-19: qty 4

## 2023-01-19 NOTE — Patient Instructions (Signed)
Blood Transfusion, Adult, Care After The following information offers guidance on how to care for yourself after your procedure. Your health care provider may also give you more specific instructions. If you have problems or questions, contact your health care provider. What can I expect after the procedure? After the procedure, it is common to have: Bruising and soreness where the IV was inserted. A headache. Follow these instructions at home: IV insertion site care     Follow instructions from your health care provider about how to take care of your IV insertion site. Make sure you: Wash your hands with soap and water for at least 20 seconds before and after you change your bandage (dressing). If soap and water are not available, use hand sanitizer. Change your dressing as told by your health care provider. Check your IV insertion site every day for signs of infection. Check for: Redness, swelling, or pain. Bleeding from the site. Warmth. Pus or a bad smell. General instructions Take over-the-counter and prescription medicines only as told by your health care provider. Rest as told by your health care provider. Return to your normal activities as told by your health care provider. Keep all follow-up visits. Lab tests may need to be done at certain periods to recheck your blood counts. Contact a health care provider if: You have itching or red, swollen areas of skin (hives). You have a fever or chills. You have pain in the head, back, or chest. You feel anxious or you feel weak after doing your normal activities. You have redness, swelling, warmth, or pain around the IV insertion site. You have blood coming from the IV insertion site that does not stop with pressure. You have pus or a bad smell coming from your IV insertion site. If you received your blood transfusion in an outpatient setting, you will be told whom to contact to report any reactions. Get help right away if: You  have symptoms of a serious allergic or immune system reaction, including: Trouble breathing or shortness of breath. Swelling of the face, feeling flushed, or widespread rash. Dark urine or blood in the urine. Fast heartbeat. These symptoms may be an emergency. Get help right away. Call 911. Do not wait to see if the symptoms will go away. Do not drive yourself to the hospital. Summary Bruising and soreness around the IV insertion site are common. Check your IV insertion site every day for signs of infection. Rest as told by your health care provider. Return to your normal activities as told by your health care provider. Get help right away for symptoms of a serious allergic or immune system reaction to the blood transfusion. This information is not intended to replace advice given to you by your health care provider. Make sure you discuss any questions you have with your health care provider. Document Revised: 02/09/2022 Document Reviewed: 02/09/2022 Elsevier Patient Education  2023 [employer]  

## 2023-01-21 DIAGNOSIS — R918 Other nonspecific abnormal finding of lung field: Secondary | ICD-10-CM | POA: Diagnosis not present

## 2023-01-21 DIAGNOSIS — I2510 Atherosclerotic heart disease of native coronary artery without angina pectoris: Secondary | ICD-10-CM | POA: Diagnosis not present

## 2023-01-21 DIAGNOSIS — R5383 Other fatigue: Secondary | ICD-10-CM | POA: Diagnosis not present

## 2023-01-21 DIAGNOSIS — R531 Weakness: Secondary | ICD-10-CM | POA: Diagnosis not present

## 2023-01-21 DIAGNOSIS — R112 Nausea with vomiting, unspecified: Secondary | ICD-10-CM | POA: Diagnosis not present

## 2023-01-21 DIAGNOSIS — R188 Other ascites: Secondary | ICD-10-CM | POA: Diagnosis not present

## 2023-01-21 DIAGNOSIS — C8358 Lymphoblastic (diffuse) lymphoma, lymph nodes of multiple sites: Secondary | ICD-10-CM | POA: Diagnosis not present

## 2023-01-21 DIAGNOSIS — M19042 Primary osteoarthritis, left hand: Secondary | ICD-10-CM | POA: Diagnosis not present

## 2023-01-21 DIAGNOSIS — Z853 Personal history of malignant neoplasm of breast: Secondary | ICD-10-CM | POA: Diagnosis not present

## 2023-01-21 DIAGNOSIS — E871 Hypo-osmolality and hyponatremia: Secondary | ICD-10-CM | POA: Diagnosis not present

## 2023-01-21 DIAGNOSIS — K5730 Diverticulosis of large intestine without perforation or abscess without bleeding: Secondary | ICD-10-CM | POA: Diagnosis not present

## 2023-01-21 DIAGNOSIS — R197 Diarrhea, unspecified: Secondary | ICD-10-CM | POA: Diagnosis not present

## 2023-01-21 DIAGNOSIS — E872 Acidosis, unspecified: Secondary | ICD-10-CM | POA: Diagnosis not present

## 2023-01-21 DIAGNOSIS — Z9011 Acquired absence of right breast and nipple: Secondary | ICD-10-CM | POA: Diagnosis not present

## 2023-01-21 DIAGNOSIS — C8339 Diffuse large B-cell lymphoma, extranodal and solid organ sites: Secondary | ICD-10-CM | POA: Diagnosis not present

## 2023-01-21 DIAGNOSIS — D469 Myelodysplastic syndrome, unspecified: Secondary | ICD-10-CM | POA: Diagnosis not present

## 2023-01-21 DIAGNOSIS — R590 Localized enlarged lymph nodes: Secondary | ICD-10-CM | POA: Diagnosis not present

## 2023-01-21 DIAGNOSIS — D649 Anemia, unspecified: Secondary | ICD-10-CM | POA: Diagnosis not present

## 2023-01-21 DIAGNOSIS — K219 Gastro-esophageal reflux disease without esophagitis: Secondary | ICD-10-CM | POA: Diagnosis not present

## 2023-01-21 DIAGNOSIS — M19041 Primary osteoarthritis, right hand: Secondary | ICD-10-CM | POA: Diagnosis not present

## 2023-01-21 DIAGNOSIS — Z888 Allergy status to other drugs, medicaments and biological substances status: Secondary | ICD-10-CM | POA: Diagnosis not present

## 2023-01-21 DIAGNOSIS — Z9221 Personal history of antineoplastic chemotherapy: Secondary | ICD-10-CM | POA: Diagnosis not present

## 2023-01-21 DIAGNOSIS — I10 Essential (primary) hypertension: Secondary | ICD-10-CM | POA: Diagnosis not present

## 2023-01-21 DIAGNOSIS — E7800 Pure hypercholesterolemia, unspecified: Secondary | ICD-10-CM | POA: Diagnosis not present

## 2023-01-21 DIAGNOSIS — Z86711 Personal history of pulmonary embolism: Secondary | ICD-10-CM | POA: Diagnosis not present

## 2023-01-21 DIAGNOSIS — E119 Type 2 diabetes mellitus without complications: Secondary | ICD-10-CM | POA: Diagnosis not present

## 2023-01-21 DIAGNOSIS — J90 Pleural effusion, not elsewhere classified: Secondary | ICD-10-CM | POA: Diagnosis not present

## 2023-01-21 DIAGNOSIS — C799 Secondary malignant neoplasm of unspecified site: Secondary | ICD-10-CM | POA: Diagnosis not present

## 2023-01-21 DIAGNOSIS — Z8719 Personal history of other diseases of the digestive system: Secondary | ICD-10-CM | POA: Diagnosis not present

## 2023-01-21 DIAGNOSIS — E860 Dehydration: Secondary | ICD-10-CM | POA: Diagnosis not present

## 2023-01-21 DIAGNOSIS — Z91040 Latex allergy status: Secondary | ICD-10-CM | POA: Diagnosis not present

## 2023-01-21 DIAGNOSIS — K8020 Calculus of gallbladder without cholecystitis without obstruction: Secondary | ICD-10-CM | POA: Diagnosis not present

## 2023-01-21 LAB — TYPE AND SCREEN
ABO/RH(D): B NEG
Antibody Screen: NEGATIVE
Unit division: 0
Unit division: 0

## 2023-01-21 LAB — BPAM RBC
Blood Product Expiration Date: 202403272359
Blood Product Expiration Date: 202403272359
ISSUE DATE / TIME: 202402240826
ISSUE DATE / TIME: 202402240826
Unit Type and Rh: 1700
Unit Type and Rh: 1700

## 2023-01-22 ENCOUNTER — Telehealth: Payer: Self-pay | Admitting: *Deleted

## 2023-01-22 LAB — SURGICAL PATHOLOGY

## 2023-01-22 NOTE — Telephone Encounter (Signed)
        Patient  visited [facility] 01/15/2023  for treatment    Telephone encounter attempt :  [place] [phone] [address] , [place] [address] Email : [email or web address]

## 2023-01-23 ENCOUNTER — Encounter: Payer: Self-pay | Admitting: Oncology

## 2023-01-23 DIAGNOSIS — E871 Hypo-osmolality and hyponatremia: Secondary | ICD-10-CM | POA: Diagnosis not present

## 2023-01-23 DIAGNOSIS — C8258 Diffuse follicle center lymphoma, lymph nodes of multiple sites: Secondary | ICD-10-CM | POA: Diagnosis not present

## 2023-01-23 DIAGNOSIS — C8339 Diffuse large B-cell lymphoma, extranodal and solid organ sites: Secondary | ICD-10-CM | POA: Diagnosis not present

## 2023-01-24 ENCOUNTER — Other Ambulatory Visit: Payer: Self-pay | Admitting: Oncology

## 2023-01-24 ENCOUNTER — Inpatient Hospital Stay: Payer: Medicare HMO | Attending: Oncology | Admitting: Oncology

## 2023-01-24 DIAGNOSIS — C73 Malignant neoplasm of thyroid gland: Secondary | ICD-10-CM

## 2023-01-24 DIAGNOSIS — C8339 Diffuse large B-cell lymphoma, extranodal and solid organ sites: Secondary | ICD-10-CM

## 2023-01-24 DIAGNOSIS — C8330 Diffuse large B-cell lymphoma, unspecified site: Secondary | ICD-10-CM | POA: Insufficient documentation

## 2023-01-24 NOTE — Progress Notes (Signed)
[facility]  [address] [place],  [place]  [id number] [phone]  Clinic Day:  01/24/2023  Referring physician: Greig Right, MD  TELEPHONE VISIT  HISTORY OF PRESENT ILLNESS:  The patient is a 77 y.o. female who comes in today to go over her recent bone marrow biopsy results, as well as to discuss her recent right supraclavicular lymph node biopsy.  Of note, since her last visit, the patient was admitted for intractable nausea and vomiting.  Repeat scans done at that time showed supraclavicular, mediastinal, and retroperitoneal lymphadenopathy.  Furthermore, she had worsening bilateral pleural effusions and pulmonary nodules.  Another suspicious finding was that she had bilateral fullness in her renal collecting systems, highly concerning for some type of GU malignancy.  Furthermore, the patient presented last week with severe anemia and thrombocytopenia.  My initial concern was that she may have developed treatment-related myelodysplasia from her previous Adriamycin chemotherapy that was given in 2016 for her breast cancer.  With respect to her breast cancer history,  she was diagnosed with stage IIIA (T0 N2a M0) triple negative breast cancer, which included regional lymph node involvement of her right axillary and subpectoral regions.  Of note, a breast MRI never revealed a primary right breast mass.  The patient received 6 cycles of neoadjuvant TAC chemotherapy before undergoing a right mastectomy in November 2016.  The pathology from this surgery still showed no obvious primary breast mass, but 9/27 axillary lymph nodes removed contained cancer.  She then underwent radiation to her right chest wall/axillary region.  Since that time, all follow-up exams and mammograms have not shown any evidence of disease recurrence.    PHYSICAL EXAM: DEFERRED    PATHOLOGY:  Her bone marrow biopsy revealed the following: BONE MARROW, ASPIRATE, CLOT, CORE: -   Morphologically consistent with at least a diffuse large B-cell lymphoma with an activated/post germinal center immunophenotype  PERIPHERAL BLOOD: -  Normochromic normocytic anemia -  Thrombocytopenia  Note: The clinical history of breast cancer status post Adriamycin with a recent progressive anemia and thrombocytopenia is noted.  The bone marrow is effaced by large atypical centroblasts like B cells that coexpress MUM1 as well as CD5 and Bcl-2.  Morphologically and immunophenotypically these findings are consistent with a diffuse large B cell with an activated/post germinal center immunophenotype; however, given the high proliferation rate (essentially greater than 90%) and so-called double hit lymphoma is also considered.  The case was discussed with Dr. Bobby Rumpf during the workup of the case.  The patient also has lymphadenopathy and additional material will be obtained for further diagnostic workup and characterization.  MICROSCOPIC DESCRIPTION:  PERIPHERAL BLOOD SMEAR: The peripheral blood smear and indices are reviewed revealing a normochromic normocytic anemia and thrombocytopenia.  There is a mild anisopoikilocytosis with predominantly polychromasia.  The platelet count is overall consistent with the smear with scattered platelet clumps which may slightly abnormally decreased the platelet count; however, is considered overall not clinically significant.  The white blood cells overall are unremarkable without evidence of dysplasia or blasts.  BONE MARROW ASPIRATE: Unable to be obtained. Erythroid precursors: N/A Granulocytic precursors: N/A Megakaryocytes: N/A Lymphocytes/plasma cells: N/A  TOUCH PREPARATIONS: The touch prep shows suboptimal morphology; however, there is very limited maturing hematopoietic precursors present.  The majority of the touch prep consists of small lymphocytes with smudged scattered moderate to large, atypical type cells.  Morphologically  these could represent lymphoma cells, scattered carcinoma cells or less likely immature cells such as blasts.  CLOT AND BIOPSY: The decalcified bone marrow biopsy consists of a small core of trabecular bone with an area of hypercellular marrow approaching 100%.  This hypercellularity is predominantly secondary to large mononuclear cells with clumped chromatin and numerous small membrane brown nucleoli suggestive of a centroblast type morphology.  There is an extremely high apoptotic/mitotic rate.  This is essentially effaced the normal marrow and there are no normal hematopoietic precursors identified except a rare, scattered megakaryocytes.  IMMUNOHISTOCHEMICAL STAINS: Immunohistochemical stains were performed on the biopsy.  The cells of interest are positive for CD45, CD20 and PAX5 consistent with B-cell origin.  They are negative for CD10, BCL6, CD34, MPO, CD30, CD117, GATA3, E-cadherin, cyclin D1.  They coexpress MUM1, Bcl-2 and CD5.  The proliferation rate is greater than 90%.  IRON STAIN: Iron stains are performed on a bone marrow aspirate or touch imprint smear and section of clot. The controls stained appropriately.       Storage Iron: Not applicable only a touch prep for review      Ring Sideroblasts: Insufficient nucleated red cells for evaluation ------------------------------------------------------------------------------------------------------ Pathology from her right supraclavicular lymph node biopsy revealed the following:  SCANS: CT scans of the chest/abdomen/pelvis done while inpatient revealed the following: FINDINGS: CTA CHEST FINDINGS  Cardiovascular: The heart size is normal. No substantial pericardial effusion. Coronary artery calcification is evident. Mild atherosclerotic calcification is noted in the wall of the thoracic aorta. There is no filling defect within the opacified pulmonary arteries to suggest the presence of an acute pulmonary embolus.  The thin web-like structure identified previously in the right lower lobe pulmonary artery persists but is not as well demonstrated on today's exam (see image 108/5) and could represent trace remnant chronic pulmonary embolus.  Mediastinum/Nodes: 12 mm right supraclavicular lymph node identified on image 10/series 8 with small lymph nodes in the region of the thoracic inlet bilaterally and 9 mm short axis left thoracic inlet node on 16/8. No mediastinal lymphadenopathy. There is no hilar lymphadenopathy. Tiny hiatal hernia. The esophagus has normal imaging features. There is no axillary lymphadenopathy.  Lungs/Pleura: There is some dependent atelectasis bilaterally with progression of bilateral pleural effusions now small to moderate on the right and small on the left. No substantial change bilateral pulmonary nodules. Index left lower lobe nodule measured previously at 1.6 cm is 1.8 cm on image 58/20. 12 mm right middle lobe nodule on 55/20 was 10 mm previously when remeasured in a similar fashion today.  Musculoskeletal: No worrisome lytic or sclerotic osseous abnormality.  Review of the MIP images confirms the above findings.  CT ABDOMEN and PELVIS FINDINGS  Hepatobiliary: No suspicious focal abnormality within the liver parenchyma. Calcified gallstone again noted. No intrahepatic or extrahepatic biliary dilation.  Pancreas: No focal mass lesion. No dilatation of the main duct. No intraparenchymal cyst. No peripancreatic edema.  Spleen: Spleen is enlarged at 15.4 cm craniocaudal length (1154 cc estimated volume).  Adrenals/Urinary Tract: No adrenal nodule or mass. Abnormal soft tissue fills the renal pelvis of each kidney (see axial 29/3 and coronal 38/607). No renal cortical mass lesion evident. No evidence for hydroureter. Bladder nondistended but otherwise unremarkable.  Stomach/Bowel: Stomach is unremarkable. No gastric wall thickening. No evidence of outlet  obstruction. Duodenum is normally positioned as is the ligament of Treitz. No small bowel wall thickening. No small bowel dilatation. The terminal ileum is normal. The appendix is not well visualized, but there is no edema or inflammation in the region of the cecum. No gross colonic  mass. No colonic wall thickening. Diverticular changes are noted in the left colon without evidence of diverticulitis.  Vascular/Lymphatic: There is mild atherosclerotic calcification of the abdominal aorta without aneurysm. Lymphadenopathy identified in the gastrohepatic and hepatoduodenal ligaments with associated central mesenteric and retroperitoneal lymphadenopathy. Index pre aortic node on 34/3 measures 2.2 cm short axis. Central mesenteric node on 51/3 measures 2.3 cm short axis. Pelvic sidewall and right groin adenopathy associated. Right pelvic sidewall lymph node on 73/3 measures 15 mm short axis. Index node in the right groin is 2.4 cm short axis on 88/3.  Reproductive: Hysterectomy. There is no adnexal mass.  Other: Small volume free fluid adjacent to the liver and in the anatomic pelvis.  Musculoskeletal: No worrisome lytic or sclerotic osseous abnormality.  Review of the MIP images confirms the above findings.  IMPRESSION: 1. No CT evidence for acute pulmonary embolus. The thin web-like structure identified previously in the right lower lobe pulmonary artery persists but is not as well demonstrated on today's exam. This could represent trace remnant chronic pulmonary embolus. 2. Interval progression of bilateral pleural effusions now small to moderate on the right and small on the left. 3. No substantial change in bilateral pulmonary nodules, consistent with metastatic disease. 4. Abnormal soft tissue fills the renal pelvis of each kidney and an upper pole calyx of the right kidney. Imaging features are concerning for urothelial neoplasm. 5. Lymphadenopathy in the supraclavicular  region, thoracic inlet, abdomen, and pelvis, consistent with metastatic disease. 6. Splenomegaly. 7. Cholelithiasis. 8. Left colonic diverticulosis without diverticulitis. 9. Small volume free fluid adjacent to the liver and in the anatomic pelvis. 10. Aortic Atherosclerosis (ICD10-I70.0).  LABS:   ASSESSMENT & PLAN:  Assessment/Plan:  A 77 y.o. female who appears to have at stage IVA diffuse large B-cell lymphoma.  This is based upon her bone marrow being inundated with this disease.  Based upon her bone marrow workup, I am very concerned she has double or triple hit diffuse large B-cell lymphoma.  The initial reason for doing her right supraclavicular lymph node biopsy was to get more tissue to run tests to confirm double or triple hit diffuse large B-cell lymphoma.  It was never suspected that this biopsy would reveal a different type of malignancy.  Unfortunately, her right supraclavicular lymph node biopsy is consistent with metastatic thyroid cancer.  Of note, there is no history of any thyroid abnormalities being seen on recent scans.  However, a more detailed thyroid evaluation will likely need to be done.  Furthermore, I will check her serum thyroglobulin level to see how high it is.  Her most recent CT scans also showed evidence of bilateral fullness in her renal collecting systems, which was concerning for urothelial neoplasms. Ultimately, in the immediate future, these areas may need to be biopsied to prove what they represent.  Overall, this patient's case is extremely complicated.  However, when looking at her entire clinical picture, it appears to me that her diffuse large B-cell lymphoma is the major malignancy that could take her life in a matter of months as it is behind her cytopenias because of its complete takeover of her bone marrow.  I do believe she needs to be referred to an academic institution so she can get the necessary workup to prove that she has either a double or triple  hit lymphoma.  If that is the case, then she knows she will need more aggressive chemotherapy, which could not be given at our cancer center.  If  she happens to come back with no evidence of a double or triple hit lymphoma, I would not have a problem implementing R-CHOP chemotherapy at our facility.  While she is at a tertiary care facility, her metastatic thyroid cancer will need to be more clearly worked up.  Although this disease is considered metastatic in nature, it still does not appear to warrant treatment consideration over her diffuse large B-cell lymphoma.  The same thing goes for her potentially bilateral collecting duct urothelial neoplasms.  Although they likely need to be biopsied, I am not entirely convinced these neoplasms are behind her systemic disease.  As mentioned previously, this patient has bilateral pulmonary nodules and pleural effusions.  It may be necessary to perform a thoracentesis, which can be sent for cytology/flow cytometry to determine if these effusions are malignant in nature and what from neoplasm they actually represent.  All of this was discussed with the patient and her husband over the phone.  They understand the extreme complexity of her clinical picture, thus the reason for referring her to [facility] next week.  They understand all the plans discussed over the phone appointment today and are in agreement with them.   With pathologic stage IIIA (T0 N2a M0) triple negative breast cancer, status post neoadjuvant chemotherapy, a right mastectomy, and adjuvant breast radiation.  She is 7 years out from the completion of all of her breast cancer therapy.  This patient's acute clinical picture is extremely concerning.  Her peripheral counts, in particular, her hemoglobin, have abruptly plummeted.  Her platelets are also much lower than what they have been.  When factoring in both  nucleated and tear drop red cells being seen with her peripheral smear, I  am very concerned an infiltrative bone marrow process is present.  Based upon this, I will perform a bone marrow biopsy tomorrow.  I do not believe she has recurrent, metastatic breast cancer, particularly as it would be rare for triple negative breast cancer to wait 7 years to come back.  My other concern is the possibility of treatment related myelodysplasia or acute leukemia.  Although not common, the adriamycin she received 7 years ago could have triggered bone marrow changes that we are currently seeing.  I do believe her bilateral lung nodules and lymphadenopathy seen on her recent chest CT are tied in to whatever is going on within her bone marrow.  I do not believe she is dealing with 2 separate issues; thus the reason I am doing her bone marrow biopsy tomorrow.  I will also arrange for her to receive 2 units of blood in the forthcoming days to offset her degree of anemia.  I will tentatively see her back in 2 weeks, if not sooner, to go over her bone marrow biopsy results and their implications.  The patient and her family understand all the plans discussed today and are in agreement with them.    Abass Misener Macarthur Critchley, MD

## 2023-01-25 ENCOUNTER — Inpatient Hospital Stay: Payer: Medicare HMO | Attending: Oncology

## 2023-01-25 ENCOUNTER — Encounter: Payer: Self-pay | Admitting: Oncology

## 2023-01-25 DIAGNOSIS — C73 Malignant neoplasm of thyroid gland: Secondary | ICD-10-CM

## 2023-01-25 DIAGNOSIS — C8331 Diffuse large B-cell lymphoma, lymph nodes of head, face, and neck: Secondary | ICD-10-CM | POA: Insufficient documentation

## 2023-01-25 LAB — CBC WITH DIFFERENTIAL (CANCER CENTER ONLY)
Abs Immature Granulocytes: 1.32 10*3/uL — ABNORMAL HIGH (ref 0.00–0.07)
Basophils Absolute: 0.2 10*3/uL — ABNORMAL HIGH (ref 0.0–0.1)
Basophils Relative: 2 %
Eosinophils Absolute: 0.2 10*3/uL (ref 0.0–0.5)
Eosinophils Relative: 2 %
HCT: 26.9 % — ABNORMAL LOW (ref 36.0–46.0)
Hemoglobin: 8.6 g/dL — ABNORMAL LOW (ref 12.0–15.0)
Immature Granulocytes: 13 %
Lymphocytes Relative: 10 %
Lymphs Abs: 1.1 10*3/uL (ref 0.7–4.0)
MCH: 28.1 pg (ref 26.0–34.0)
MCHC: 32 g/dL (ref 30.0–36.0)
MCV: 87.9 fL (ref 80.0–100.0)
Monocytes Absolute: 3.2 10*3/uL — ABNORMAL HIGH (ref 0.1–1.0)
Monocytes Relative: 30 %
Neutro Abs: 4.6 10*3/uL (ref 1.7–7.7)
Neutrophils Relative %: 43 %
Platelet Count: 32 10*3/uL — ABNORMAL LOW (ref 150–400)
RBC: 3.06 MIL/uL — ABNORMAL LOW (ref 3.87–5.11)
RDW: 21.2 % — ABNORMAL HIGH (ref 11.5–15.5)
WBC Count: 10.6 10*3/uL — ABNORMAL HIGH (ref 4.0–10.5)
nRBC: 11.5 % — ABNORMAL HIGH (ref 0.0–0.2)

## 2023-01-25 LAB — SAMPLE TO BLOOD BANK

## 2023-01-26 DIAGNOSIS — E878 Other disorders of electrolyte and fluid balance, not elsewhere classified: Secondary | ICD-10-CM | POA: Diagnosis not present

## 2023-01-26 DIAGNOSIS — N179 Acute kidney failure, unspecified: Secondary | ICD-10-CM | POA: Diagnosis not present

## 2023-01-26 DIAGNOSIS — R918 Other nonspecific abnormal finding of lung field: Secondary | ICD-10-CM | POA: Diagnosis not present

## 2023-01-26 DIAGNOSIS — E8729 Other acidosis: Secondary | ICD-10-CM | POA: Diagnosis not present

## 2023-01-26 DIAGNOSIS — Z91040 Latex allergy status: Secondary | ICD-10-CM | POA: Diagnosis not present

## 2023-01-26 DIAGNOSIS — C8338 Diffuse large B-cell lymphoma, lymph nodes of multiple sites: Secondary | ICD-10-CM | POA: Diagnosis not present

## 2023-01-26 DIAGNOSIS — Z7982 Long term (current) use of aspirin: Secondary | ICD-10-CM | POA: Diagnosis not present

## 2023-01-26 DIAGNOSIS — A419 Sepsis, unspecified organism: Secondary | ICD-10-CM | POA: Diagnosis not present

## 2023-01-26 DIAGNOSIS — C8339 Diffuse large B-cell lymphoma, extranodal and solid organ sites: Secondary | ICD-10-CM | POA: Diagnosis not present

## 2023-01-26 DIAGNOSIS — Z7984 Long term (current) use of oral hypoglycemic drugs: Secondary | ICD-10-CM | POA: Diagnosis not present

## 2023-01-26 DIAGNOSIS — E7800 Pure hypercholesterolemia, unspecified: Secondary | ICD-10-CM | POA: Diagnosis not present

## 2023-01-26 DIAGNOSIS — E871 Hypo-osmolality and hyponatremia: Secondary | ICD-10-CM | POA: Diagnosis not present

## 2023-01-26 DIAGNOSIS — R2241 Localized swelling, mass and lump, right lower limb: Secondary | ICD-10-CM | POA: Diagnosis not present

## 2023-01-26 DIAGNOSIS — Z792 Long term (current) use of antibiotics: Secondary | ICD-10-CM | POA: Diagnosis not present

## 2023-01-26 DIAGNOSIS — M19041 Primary osteoarthritis, right hand: Secondary | ICD-10-CM | POA: Diagnosis not present

## 2023-01-26 DIAGNOSIS — E119 Type 2 diabetes mellitus without complications: Secondary | ICD-10-CM | POA: Diagnosis not present

## 2023-01-26 DIAGNOSIS — C73 Malignant neoplasm of thyroid gland: Secondary | ICD-10-CM | POA: Diagnosis not present

## 2023-01-26 DIAGNOSIS — M19042 Primary osteoarthritis, left hand: Secondary | ICD-10-CM | POA: Diagnosis not present

## 2023-01-26 DIAGNOSIS — J189 Pneumonia, unspecified organism: Secondary | ICD-10-CM | POA: Diagnosis not present

## 2023-01-26 DIAGNOSIS — R911 Solitary pulmonary nodule: Secondary | ICD-10-CM | POA: Diagnosis not present

## 2023-01-26 DIAGNOSIS — I2510 Atherosclerotic heart disease of native coronary artery without angina pectoris: Secondary | ICD-10-CM | POA: Diagnosis not present

## 2023-01-26 DIAGNOSIS — Z853 Personal history of malignant neoplasm of breast: Secondary | ICD-10-CM | POA: Diagnosis not present

## 2023-01-26 DIAGNOSIS — Z888 Allergy status to other drugs, medicaments and biological substances status: Secondary | ICD-10-CM | POA: Diagnosis not present

## 2023-01-26 DIAGNOSIS — R079 Chest pain, unspecified: Secondary | ICD-10-CM | POA: Diagnosis not present

## 2023-01-26 DIAGNOSIS — D61818 Other pancytopenia: Secondary | ICD-10-CM | POA: Diagnosis not present

## 2023-01-26 DIAGNOSIS — Z86711 Personal history of pulmonary embolism: Secondary | ICD-10-CM | POA: Diagnosis not present

## 2023-01-26 DIAGNOSIS — I959 Hypotension, unspecified: Secondary | ICD-10-CM | POA: Diagnosis not present

## 2023-01-26 DIAGNOSIS — C50919 Malignant neoplasm of unspecified site of unspecified female breast: Secondary | ICD-10-CM | POA: Diagnosis not present

## 2023-01-26 DIAGNOSIS — J90 Pleural effusion, not elsewhere classified: Secondary | ICD-10-CM | POA: Diagnosis not present

## 2023-01-26 DIAGNOSIS — Z9221 Personal history of antineoplastic chemotherapy: Secondary | ICD-10-CM | POA: Diagnosis not present

## 2023-01-26 DIAGNOSIS — Z9011 Acquired absence of right breast and nipple: Secondary | ICD-10-CM | POA: Diagnosis not present

## 2023-01-26 DIAGNOSIS — R600 Localized edema: Secondary | ICD-10-CM | POA: Diagnosis not present

## 2023-01-27 DIAGNOSIS — C8339 Diffuse large B-cell lymphoma, extranodal and solid organ sites: Secondary | ICD-10-CM

## 2023-01-27 DIAGNOSIS — C8338 Diffuse large B-cell lymphoma, lymph nodes of multiple sites: Secondary | ICD-10-CM | POA: Diagnosis not present

## 2023-01-27 DIAGNOSIS — E871 Hypo-osmolality and hyponatremia: Secondary | ICD-10-CM | POA: Diagnosis not present

## 2023-01-27 DIAGNOSIS — D61818 Other pancytopenia: Secondary | ICD-10-CM | POA: Diagnosis not present

## 2023-01-27 DIAGNOSIS — I959 Hypotension, unspecified: Secondary | ICD-10-CM | POA: Diagnosis not present

## 2023-01-27 DIAGNOSIS — Z743 Need for continuous supervision: Secondary | ICD-10-CM | POA: Diagnosis not present

## 2023-01-27 DIAGNOSIS — C73 Malignant neoplasm of thyroid gland: Secondary | ICD-10-CM

## 2023-01-28 ENCOUNTER — Ambulatory Visit: Payer: Medicare HMO | Admitting: Cardiology

## 2023-02-01 ENCOUNTER — Other Ambulatory Visit: Payer: Medicare HMO

## 2023-02-01 ENCOUNTER — Encounter: Payer: Self-pay | Admitting: Oncology

## 2023-02-01 ENCOUNTER — Ambulatory Visit: Payer: Medicare HMO | Admitting: Oncology

## 2023-02-08 LAB — THYROGLOBULIN LEVEL: Thyroglobulin: 94 ng/mL — ABNORMAL HIGH

## 2023-02-25 DEATH — deceased

## 2023-11-05 ENCOUNTER — Ambulatory Visit: Payer: Medicare HMO | Admitting: Oncology
